# Patient Record
Sex: Male | Born: 1955 | Race: White | Hispanic: No | Smoking: Never smoker
Health system: Southern US, Community
[De-identification: ages and names within clinical notes are randomized; demographics above are authoritative.]

## PROBLEM LIST (undated history)

## (undated) DIAGNOSIS — K219 Gastro-esophageal reflux disease without esophagitis: Secondary | ICD-10-CM

## (undated) DIAGNOSIS — F32A Depression, unspecified: Secondary | ICD-10-CM

## (undated) DIAGNOSIS — F419 Anxiety disorder, unspecified: Secondary | ICD-10-CM

## (undated) DIAGNOSIS — I10 Essential (primary) hypertension: Secondary | ICD-10-CM

## (undated) HISTORY — PX: HERNIA REPAIR: SHX51

## (undated) HISTORY — PX: COLONOSCOPY: SHX174

---

## 2005-12-05 ENCOUNTER — Inpatient Hospital Stay (HOSPITAL_COMMUNITY): Admission: EM | Admit: 2005-12-05 | Discharge: 2005-12-06 | Payer: Self-pay | Admitting: Emergency Medicine

## 2005-12-05 ENCOUNTER — Ambulatory Visit: Payer: Self-pay | Admitting: Internal Medicine

## 2006-01-01 ENCOUNTER — Ambulatory Visit: Payer: Self-pay | Admitting: Internal Medicine

## 2006-01-01 ENCOUNTER — Ambulatory Visit (HOSPITAL_COMMUNITY): Admission: RE | Admit: 2006-01-01 | Discharge: 2006-01-01 | Payer: Self-pay | Admitting: Internal Medicine

## 2010-06-15 ENCOUNTER — Emergency Department (HOSPITAL_COMMUNITY): Payer: BC Managed Care – PPO

## 2010-06-15 ENCOUNTER — Encounter (HOSPITAL_COMMUNITY): Payer: Self-pay | Admitting: Radiology

## 2010-06-15 ENCOUNTER — Emergency Department (HOSPITAL_COMMUNITY)
Admission: EM | Admit: 2010-06-15 | Discharge: 2010-06-15 | Disposition: A | Discharge status: Home / Self Care | Payer: BC Managed Care – PPO | Attending: Emergency Medicine | Admitting: Emergency Medicine

## 2010-06-15 DIAGNOSIS — I10 Essential (primary) hypertension: Secondary | ICD-10-CM | POA: Insufficient documentation

## 2010-06-15 DIAGNOSIS — Y92009 Unspecified place in unspecified non-institutional (private) residence as the place of occurrence of the external cause: Secondary | ICD-10-CM | POA: Insufficient documentation

## 2010-06-15 DIAGNOSIS — Y998 Other external cause status: Secondary | ICD-10-CM | POA: Insufficient documentation

## 2010-06-15 DIAGNOSIS — Y93H3 Activity, building and construction: Secondary | ICD-10-CM | POA: Insufficient documentation

## 2010-06-15 DIAGNOSIS — S2220XA Unspecified fracture of sternum, initial encounter for closed fracture: Secondary | ICD-10-CM | POA: Insufficient documentation

## 2010-06-15 DIAGNOSIS — Z79899 Other long term (current) drug therapy: Secondary | ICD-10-CM | POA: Insufficient documentation

## 2010-06-15 DIAGNOSIS — W1789XA Other fall from one level to another, initial encounter: Secondary | ICD-10-CM | POA: Insufficient documentation

## 2010-06-15 HISTORY — DX: Essential (primary) hypertension: I10

## 2011-08-11 ENCOUNTER — Encounter (HOSPITAL_COMMUNITY): Payer: Self-pay | Admitting: *Deleted

## 2011-08-11 ENCOUNTER — Emergency Department (HOSPITAL_COMMUNITY)
Admission: EM | Admit: 2011-08-11 | Discharge: 2011-08-11 | Disposition: A | Discharge status: Home / Self Care | Payer: BC Managed Care – PPO | Attending: Emergency Medicine | Admitting: Emergency Medicine

## 2011-08-11 ENCOUNTER — Emergency Department (HOSPITAL_COMMUNITY): Payer: BC Managed Care – PPO

## 2011-08-11 DIAGNOSIS — L0390 Cellulitis, unspecified: Secondary | ICD-10-CM

## 2011-08-11 DIAGNOSIS — IMO0002 Reserved for concepts with insufficient information to code with codable children: Secondary | ICD-10-CM | POA: Insufficient documentation

## 2011-08-11 HISTORY — DX: Gastro-esophageal reflux disease without esophagitis: K21.9

## 2011-08-11 LAB — CBC
HCT: 43.6 % (ref 39.0–52.0)
Hemoglobin: 15.4 g/dL (ref 13.0–17.0)
MCH: 31.7 pg (ref 26.0–34.0)
MCHC: 35.3 g/dL (ref 30.0–36.0)
MCV: 89.7 fL (ref 78.0–100.0)
Platelets: 173 10*3/uL (ref 150–400)
RBC: 4.86 MIL/uL (ref 4.22–5.81)
RDW: 12.1 % (ref 11.5–15.5)
WBC: 16 10*3/uL — ABNORMAL HIGH (ref 4.0–10.5)

## 2011-08-11 LAB — BASIC METABOLIC PANEL
BUN: 9 mg/dL (ref 6–23)
CO2: 16 mEq/L — ABNORMAL LOW (ref 19–32)
Calcium: 9.5 mg/dL (ref 8.4–10.5)
Chloride: 99 mEq/L (ref 96–112)
Creatinine, Ser: 0.94 mg/dL (ref 0.50–1.35)
GFR calc Af Amer: >90 mL/min (ref >90)
GFR calc non Af Amer: >90 mL/min (ref >90)
Glucose, Bld: 185 mg/dL — ABNORMAL HIGH (ref 70–99)
Potassium: 3.8 mEq/L (ref 3.5–5.1)
Sodium: 132 mEq/L — ABNORMAL LOW (ref 135–145)

## 2011-08-11 LAB — DIFFERENTIAL
Basophils Absolute: 0.1 10*3/uL (ref 0.0–0.1)
Basophils Relative: 0 % (ref 0–1)
Eosinophils Absolute: 0.1 10*3/uL (ref 0.0–0.7)
Eosinophils Relative: 0 % (ref 0–5)
Lymphocytes Relative: 11 % — ABNORMAL LOW (ref 12–46)
Lymphs Abs: 1.8 10*3/uL (ref 0.7–4.0)
Monocytes Absolute: 2.2 10*3/uL — ABNORMAL HIGH (ref 0.1–1.0)
Monocytes Relative: 14 % — ABNORMAL HIGH (ref 3–12)
Neutro Abs: 11.9 10*3/uL — ABNORMAL HIGH (ref 1.7–7.7)
Neutrophils Relative %: 74 % (ref 43–77)

## 2011-08-11 LAB — C-REACTIVE PROTEIN: CRP: 12.05 mg/dL — ABNORMAL HIGH (ref <0.60)

## 2011-08-11 LAB — SEDIMENTATION RATE: Sed Rate: 20 mm/hr — ABNORMAL HIGH (ref 0–16)

## 2011-08-11 MED ORDER — VANCOMYCIN HCL IN DEXTROSE 1-5 GM/200ML-% IV SOLN
1000.0000 mg | Freq: Once | INTRAVENOUS | Status: AC
  Administered 2011-08-11: 1000 mg via INTRAVENOUS
  Filled 2011-08-11: qty 200

## 2011-08-11 MED ORDER — CEPHALEXIN 500 MG PO CAPS: 500.0000 mg | ORAL_CAPSULE | Freq: Four times a day (QID) | ORAL | Status: AC

## 2011-08-11 MED ORDER — SULFAMETHOXAZOLE-TRIMETHOPRIM 800-160 MG PO TABS: 1.0000 | ORAL_TABLET | Freq: Two times a day (BID) | ORAL | Status: AC

## 2011-08-11 MED ORDER — HYDROCODONE-ACETAMINOPHEN 5-325 MG PO TABS: 2.0000 | ORAL_TABLET | ORAL | Status: AC | PRN

## 2011-08-11 NOTE — Discharge Instructions (Signed)
Cellulitis Have your elbow rechecked in 48 hours. You can be checked in the ED or with your PCP. Return if the redness passes the marker line. Return to the ED if you develop new or worsening symptoms. Cellulitis is an infection of the skin and the tissue beneath it. The area is typically red and tender. It is caused by germs (bacteria) (usually staph or strep) that enter the body through cuts or sores. Cellulitis most commonly occurs in the arms or lower legs.  HOME CARE INSTRUCTIONS   If you are given a prescription for medications which kill germs (antibiotics), take as directed until finished.   If the infection is on the arm or leg, keep the limb elevated as able.   Use a warm cloth several times per day to relieve pain and encourage healing.   See your caregiver for recheck of the infected site as directed if problems arise.   Only take over-the-counter or prescription medicines for pain, discomfort, or fever as directed by your caregiver.  SEEK MEDICAL CARE IF:   The area of redness (inflammation) is spreading, there are red streaks coming from the infected site, or if a part of the infection begins to turn dark in color.   The joint or bone underneath the infected skin becomes painful after the skin has healed.   The infection returns in the same or another area after it seems to have gone away.   A boil or bump swells up. This may be an abscess.   New, unexplained problems such as pain or fever develop.  SEEK IMMEDIATE MEDICAL CARE IF:   You have a fever.   You or your child feels drowsy or lethargic.   There is vomiting, diarrhea, or lasting discomfort or feeling ill (malaise) with muscle aches and pains.  MAKE SURE YOU:   Understand these instructions.   Will watch your condition.   Will get help right away if you are not doing well or get worse.  Document Released: 11/14/2004 Document Revised: 01/24/2011 Document Reviewed: 09/23/2007 [facility] Patient Information  2012 [place], [place].

## 2011-08-11 NOTE — ED Notes (Signed)
Pt has large, swollen, red area to right elbow

## 2011-08-11 NOTE — ED Provider Notes (Signed)
History     CSN: [id number]  Arrival date & time 08/11/11  7829   First MD Initiated Contact with Patient 08/11/11 [phone]      Chief Complaint  Patient presents with  . Cellulitis    (Consider location/radiation/quality/duration/timing/severity/associated sxs/prior treatment) HPI Comments: Patient presents with pain, redness and swelling to right elbow for the past one day.  Thinks he may have been bitten by a spider but doesn't recall any cuts or trauma.  No fever or vomiting.  Some nausea this morning.  Able to flex and extend elbow normally. No weakness, numbness, tingling.  The history is provided by the patient.    Past Medical History  Diagnosis Date  . Hypertension   . Acid reflux     Past Surgical History  Procedure Date  . Hernia repair     History reviewed. No pertinent family history.  History  Substance Use Topics  . Smoking status: Not on file  . Smokeless tobacco: Not on file  . Alcohol Use: Yes      Review of Systems  Constitutional: Negative for fever and activity change.  HENT: Negative for congestion and rhinorrhea.   Eyes: Negative for visual disturbance.  Respiratory: Negative for cough, chest tightness and shortness of breath.   Cardiovascular: Negative for chest pain.  Gastrointestinal: Negative for nausea and abdominal pain.  Genitourinary: Negative for dysuria and hematuria.  Musculoskeletal: Positive for joint swelling.  Skin: Positive for rash.  Neurological: Negative for dizziness, weakness and headaches.    Allergies  Review of patient's allergies indicates no known allergies.  Home Medications   Current Outpatient Rx  Name Route Sig Dispense Refill  . PANTOPRAZOLE SODIUM 40 MG PO TBEC Oral Take 40 mg by mouth daily.    Marland Kitchen UNKNOWN TO PATIENT        BP 145/89  Pulse 96  Temp 97.9 F (36.6 C)  Resp 20  Ht 5\' 10"  (1.778 m)  Wt 210 lb (95.255 kg)  BMI 30.13 kg/m2  SpO2 100%  Physical Exam  Constitutional: He is oriented  to person, place, and time. He appears well-developed and well-nourished. No distress.  HENT:  Head: Normocephalic and atraumatic.  Mouth/Throat: Oropharynx is clear and moist. No oropharyngeal exudate.  Eyes: Conjunctivae are normal. Pupils are equal, round, and reactive to light.  Neck: Normal range of motion. Neck supple.  Cardiovascular: Normal rate, regular rhythm and normal heart sounds.   No murmur heard. Pulmonary/Chest: Effort normal and breath sounds normal. No respiratory distress.  Abdominal: Soft. There is no tenderness. There is no rebound and no guarding.  Musculoskeletal: Normal range of motion. He exhibits edema and tenderness.       6 x 12 cm area of erythema, edema, warmth, involving R elbow.  No fluctuance. FROM of elbow joint.  +2 radial pulse.  Neurological: He is alert and oriented to person, place, and time. No cranial nerve deficit.  Skin: Skin is warm.    ED Course  Procedures (including critical care time)  Labs Reviewed  CBC - Abnormal; Notable for the following:    WBC 16.0 (*)     All other components within normal limits  DIFFERENTIAL - Abnormal; Notable for the following:    Neutro Abs 11.9 (*)     Lymphocytes Relative 11 (*)     Monocytes Relative 14 (*)     Monocytes Absolute 2.2 (*)     All other components within normal limits  BASIC METABOLIC PANEL  SEDIMENTATION RATE  C-REACTIVE PROTEIN   Dg Elbow Complete Right  08/11/2011  *RADIOLOGY REPORT*  Clinical Data: Right elbow swelling and erythema.  RIGHT ELBOW - COMPLETE 3+ VIEW  Comparison: None.  Findings: There is no evidence of fracture or dislocation.  The visualized joint spaces are preserved.  No significant joint effusion is identified.  The soft tissues are unremarkable in appearance.  IMPRESSION: No evidence of fracture or dislocation.  Original Report Authenticated By: Tonia Ghent, M.D.     1. Cellulitis       MDM  Cellulitis overlying elbow joint.  No fever.    Able to flex  and extend elbow joint normally.  Low suspicion for septic joint.  Arthrocentesis contraindicated in presence of cellulitis anyway.  Area of cellulitis outlined. Xray, vancomycin, recheck in 48 hours.        [name], MD 08/11/11 [phone]

## 2012-06-03 ENCOUNTER — Encounter (HOSPITAL_COMMUNITY): Payer: Self-pay

## 2012-06-03 ENCOUNTER — Other Ambulatory Visit (HOSPITAL_COMMUNITY): Payer: Self-pay | Admitting: Orthopaedic Surgery

## 2012-06-03 ENCOUNTER — Emergency Department (HOSPITAL_COMMUNITY): Payer: BC Managed Care – PPO

## 2012-06-03 ENCOUNTER — Emergency Department (HOSPITAL_COMMUNITY)
Admission: EM | Admit: 2012-06-03 | Discharge: 2012-06-03 | Disposition: A | Discharge status: Home / Self Care | Payer: BC Managed Care – PPO | Attending: Emergency Medicine | Admitting: Emergency Medicine

## 2012-06-03 DIAGNOSIS — K219 Gastro-esophageal reflux disease without esophagitis: Secondary | ICD-10-CM | POA: Insufficient documentation

## 2012-06-03 DIAGNOSIS — I10 Essential (primary) hypertension: Secondary | ICD-10-CM | POA: Insufficient documentation

## 2012-06-03 DIAGNOSIS — M25562 Pain in left knee: Secondary | ICD-10-CM

## 2012-06-03 DIAGNOSIS — X500XXA Overexertion from strenuous movement or load, initial encounter: Secondary | ICD-10-CM | POA: Insufficient documentation

## 2012-06-03 DIAGNOSIS — Z79899 Other long term (current) drug therapy: Secondary | ICD-10-CM | POA: Insufficient documentation

## 2012-06-03 DIAGNOSIS — Y9301 Activity, walking, marching and hiking: Secondary | ICD-10-CM | POA: Insufficient documentation

## 2012-06-03 DIAGNOSIS — Y929 Unspecified place or not applicable: Secondary | ICD-10-CM | POA: Insufficient documentation

## 2012-06-03 DIAGNOSIS — S99929A Unspecified injury of unspecified foot, initial encounter: Secondary | ICD-10-CM | POA: Insufficient documentation

## 2012-06-03 DIAGNOSIS — S8990XA Unspecified injury of unspecified lower leg, initial encounter: Secondary | ICD-10-CM | POA: Insufficient documentation

## 2012-06-03 DIAGNOSIS — S8992XA Unspecified injury of left lower leg, initial encounter: Secondary | ICD-10-CM

## 2012-06-03 MED ORDER — IBUPROFEN 800 MG PO TABS
800.0000 mg | ORAL_TABLET | Freq: Once | ORAL | Status: AC
  Administered 2012-06-03: 800 mg via ORAL
  Filled 2012-06-03: qty 1

## 2012-06-03 MED ORDER — HYDROCODONE-ACETAMINOPHEN 5-325 MG PO TABS: 2.0000 | ORAL_TABLET | ORAL | Status: DC | PRN

## 2012-06-03 MED ORDER — IBUPROFEN 800 MG PO TABS: 800.0000 mg | ORAL_TABLET | Freq: Three times a day (TID) | ORAL | Status: DC

## 2012-06-03 NOTE — ED Notes (Signed)
Pt reports twisted left knee last week cutting fire wood.

## 2012-06-03 NOTE — ED Provider Notes (Signed)
History    This chart was scribed for [name], MD by Quintella Reichert, ED scribe.  This patient was seen in room APA09/APA09 and the patient's care was started at 8:34 AM  . CSN: [id number]  Arrival date & time 06/03/12  7829    Chief Complaint  Patient presents with  . Knee Pain     The history is provided by the patient. No language interpreter was used.     HPI Comments: Marco Stevenson is a 57 y.o. male who presents to the Emergency Department complaining of moderate, sudden-onset left knee pain that began one week ago when the patient twisted his knee.  Pt states that the pain is exacerbated by bearing weight on that leg, although he is able to stand and walk.  He states he did not fall, and denies prior knee pain.  Pt denies weakness or numbness of lower extremities, back pain, fever, sore throat, cough, SOB, nausea, emesis, or any other associated symptoms.  He has not sought prior medical treatment for current symptom, and has taken ibuprofen at home.  Pt has medical h/o HTN and GERD.  PCP is Dr. Sudie Bailey.   Past Medical History  Diagnosis Date  . Hypertension   . Acid reflux     Past Surgical History  Procedure Laterality Date  . Hernia repair      No family history on file.  History  Substance Use Topics  . Smoking status: Not on file  . Smokeless tobacco: Not on file  . Alcohol Use: Yes     Comment: occ      Review of Systems A complete 10 system review of systems was obtained and all systems are negative except as noted in the HPI and PMH.    Allergies  Review of patient's allergies indicates no known allergies.  Home Medications   Current Outpatient Rx  Name  Route  Sig  Dispense  Refill  . ibuprofen (ADVIL,MOTRIN) 200 MG tablet   Oral   Take 400 mg by mouth every 4 (four) hours as needed for pain.         Marland Kitchen losartan (COZAAR) 50 MG tablet   Oral   Take 50 mg by mouth daily.         . pantoprazole (PROTONIX) 40 MG tablet  Oral   Take 40 mg by mouth daily.         Marland Kitchen HYDROcodone-acetaminophen (NORCO/VICODIN) 5-325 MG per tablet   Oral   Take 2 tablets by mouth every 4 (four) hours as needed for pain.   10 tablet   0   . ibuprofen (ADVIL,MOTRIN) 800 MG tablet   Oral   Take 1 tablet (800 mg total) by mouth 3 (three) times daily.   21 tablet   0     BP 137/91  Pulse 71  Temp(Src) 98 F (36.7 C) (Oral)  Resp 18  Ht 5\' 8"  (1.727 m)  Wt 210 lb (95.255 kg)  BMI 31.94 kg/m2  SpO2 98%  Physical Exam  Nursing note and vitals reviewed. Constitutional: He is oriented to person, place, and time. He appears well-developed and well-nourished. No distress.  HENT:  Head: Normocephalic and atraumatic.  Eyes: Conjunctivae and EOM are normal. Pupils are equal, round, and reactive to light.  Neck: Normal range of motion. Neck supple.  Cardiovascular: Normal rate, regular rhythm and intact distal pulses.   No murmur heard. Pulmonary/Chest: Effort normal and breath sounds normal. No respiratory distress.  Abdominal: Soft.  There is no tenderness.  Musculoskeletal: Normal range of motion. He exhibits tenderness.  Left knee tenderness over medial joint line.   No effusion.   Flexion and extension intact.   No ligament laxity.   Anterior and posterior Drawer test negative.   Intact distal pulses.  Neurological: He is alert and oriented to person, place, and time.  Skin: Skin is warm and dry.  Psychiatric: He has a normal mood and affect. His behavior is normal.    ED Course  Procedures (including critical care time)  DIAGNOSTIC STUDIES: Oxygen Saturation is 98% on room air, normal by my interpretation.    COORDINATION OF CARE: 9:19 AM-Discussed treatment plan which includes x-ray and pain medication with pt at bedside and pt agreed to plan.     Labs Reviewed - No data to display Dg Knee Complete 4 Views Left  06/03/2012  *RADIOLOGY REPORT*  Clinical Data: Twisted.  Medial pain.  LEFT KNEE -  COMPLETE 4+ VIEW  Comparison: None.  Findings: There is a knee joint effusion. There is medial soft tissue swelling.  No evidence of fracture, dislocation, degenerative change or other focal finding.  IMPRESSION: Joint effusion.  Medial soft tissue swelling.  No bony finding.   Original Report Authenticated By: Paulina Fusi, M.D.      1. Knee injury, left, initial encounter       MDM  One week of L knee pain after twisting it while walking. Denies fall. No weakness, numbness, tingling.   Flexion, extension intact. No appreciable ligament laxity.  Xray negative for fracture. Knee immobilizer, RICE, f/u ortho.   I personally performed the services described in this documentation, which was scribed in my presence. The recorded information has been reviewed and is accurate.      [name], MD 06/03/12 [phone]

## 2012-06-04 ENCOUNTER — Encounter (HOSPITAL_COMMUNITY): Payer: Self-pay

## 2012-06-04 ENCOUNTER — Ambulatory Visit (HOSPITAL_COMMUNITY)
Admission: RE | Admit: 2012-06-04 | Discharge: 2012-06-04 | Disposition: A | Discharge status: Home / Self Care | Payer: BC Managed Care – PPO | Source: Ambulatory Visit | Attending: Orthopaedic Surgery | Admitting: Orthopaedic Surgery

## 2012-06-04 DIAGNOSIS — M25562 Pain in left knee: Secondary | ICD-10-CM

## 2012-06-04 DIAGNOSIS — M25569 Pain in unspecified knee: Secondary | ICD-10-CM | POA: Insufficient documentation

## 2012-06-04 DIAGNOSIS — M7120 Synovial cyst of popliteal space [Baker], unspecified knee: Secondary | ICD-10-CM | POA: Insufficient documentation

## 2012-06-04 DIAGNOSIS — S7290XA Unspecified fracture of unspecified femur, initial encounter for closed fracture: Secondary | ICD-10-CM | POA: Insufficient documentation

## 2012-06-08 ENCOUNTER — Other Ambulatory Visit (HOSPITAL_COMMUNITY): Payer: Self-pay | Admitting: Orthopaedic Surgery

## 2012-06-08 DIAGNOSIS — M810 Age-related osteoporosis without current pathological fracture: Secondary | ICD-10-CM

## 2012-06-12 ENCOUNTER — Ambulatory Visit (HOSPITAL_COMMUNITY)
Admission: RE | Admit: 2012-06-12 | Discharge: 2012-06-12 | Disposition: A | Discharge status: Home / Self Care | Payer: BC Managed Care – PPO | Source: Ambulatory Visit | Attending: Orthopaedic Surgery | Admitting: Orthopaedic Surgery

## 2012-06-12 DIAGNOSIS — M810 Age-related osteoporosis without current pathological fracture: Secondary | ICD-10-CM

## 2012-06-12 DIAGNOSIS — Z8781 Personal history of (healed) traumatic fracture: Secondary | ICD-10-CM | POA: Insufficient documentation

## 2012-06-12 DIAGNOSIS — Z13820 Encounter for screening for osteoporosis: Secondary | ICD-10-CM | POA: Insufficient documentation

## 2012-06-12 DIAGNOSIS — Z8262 Family history of osteoporosis: Secondary | ICD-10-CM | POA: Insufficient documentation

## 2014-06-13 IMAGING — CR DG KNEE COMPLETE 4+V*L*
4 series · 4 of 4 positions shown · non-contrast
Comparison: None.

CLINICAL DATA: Twisted.  Medial pain.

LEFT KNEE - COMPLETE 4+ VIEW

[view not recorded (1 of 4)]
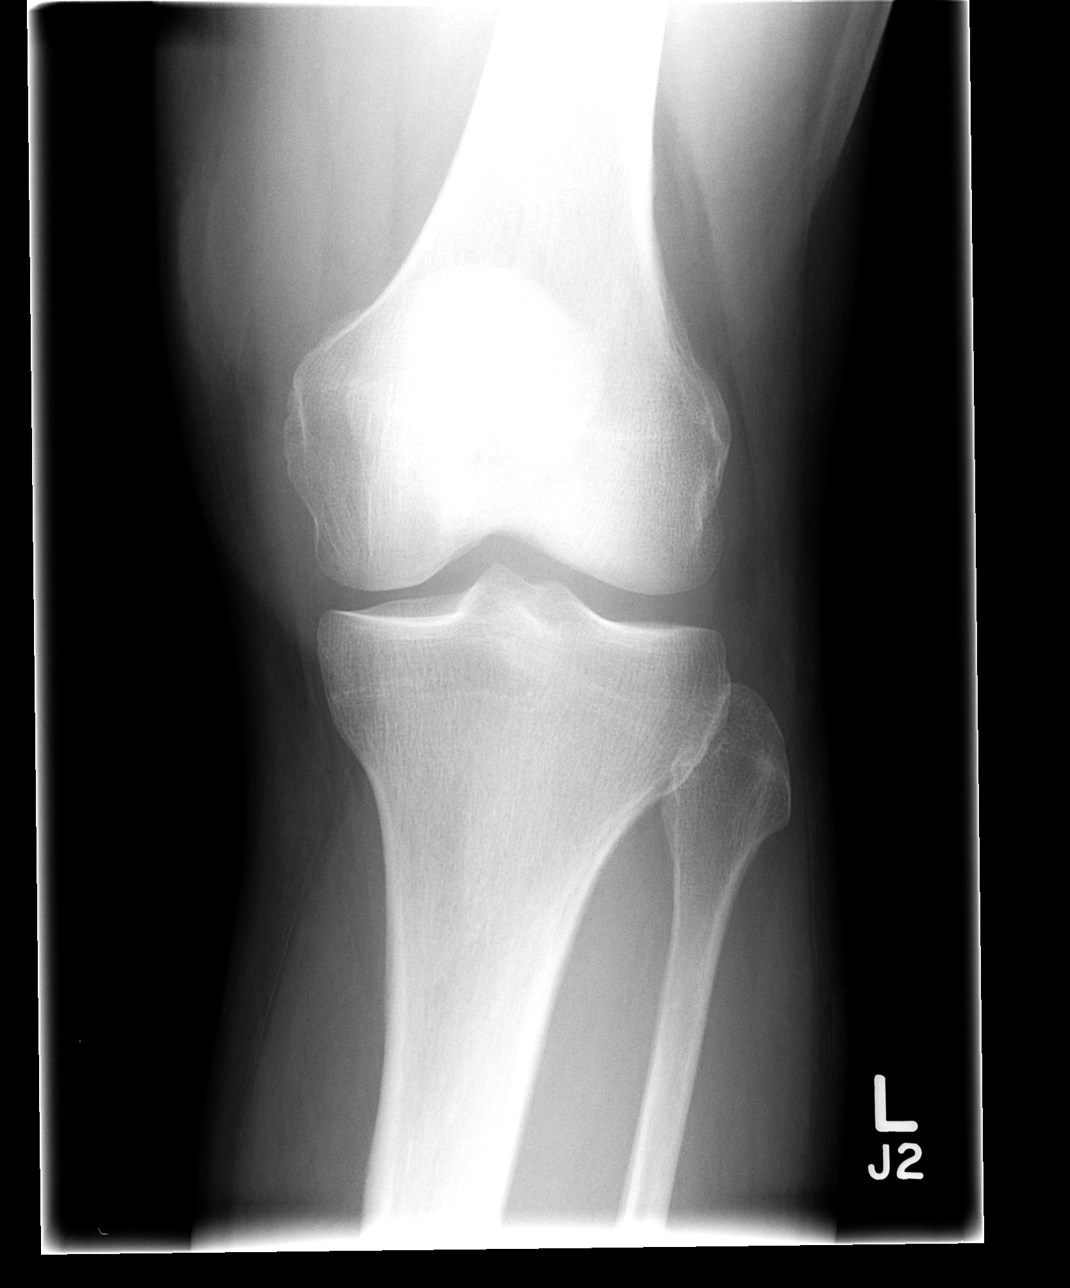

[view not recorded (2 of 4)]
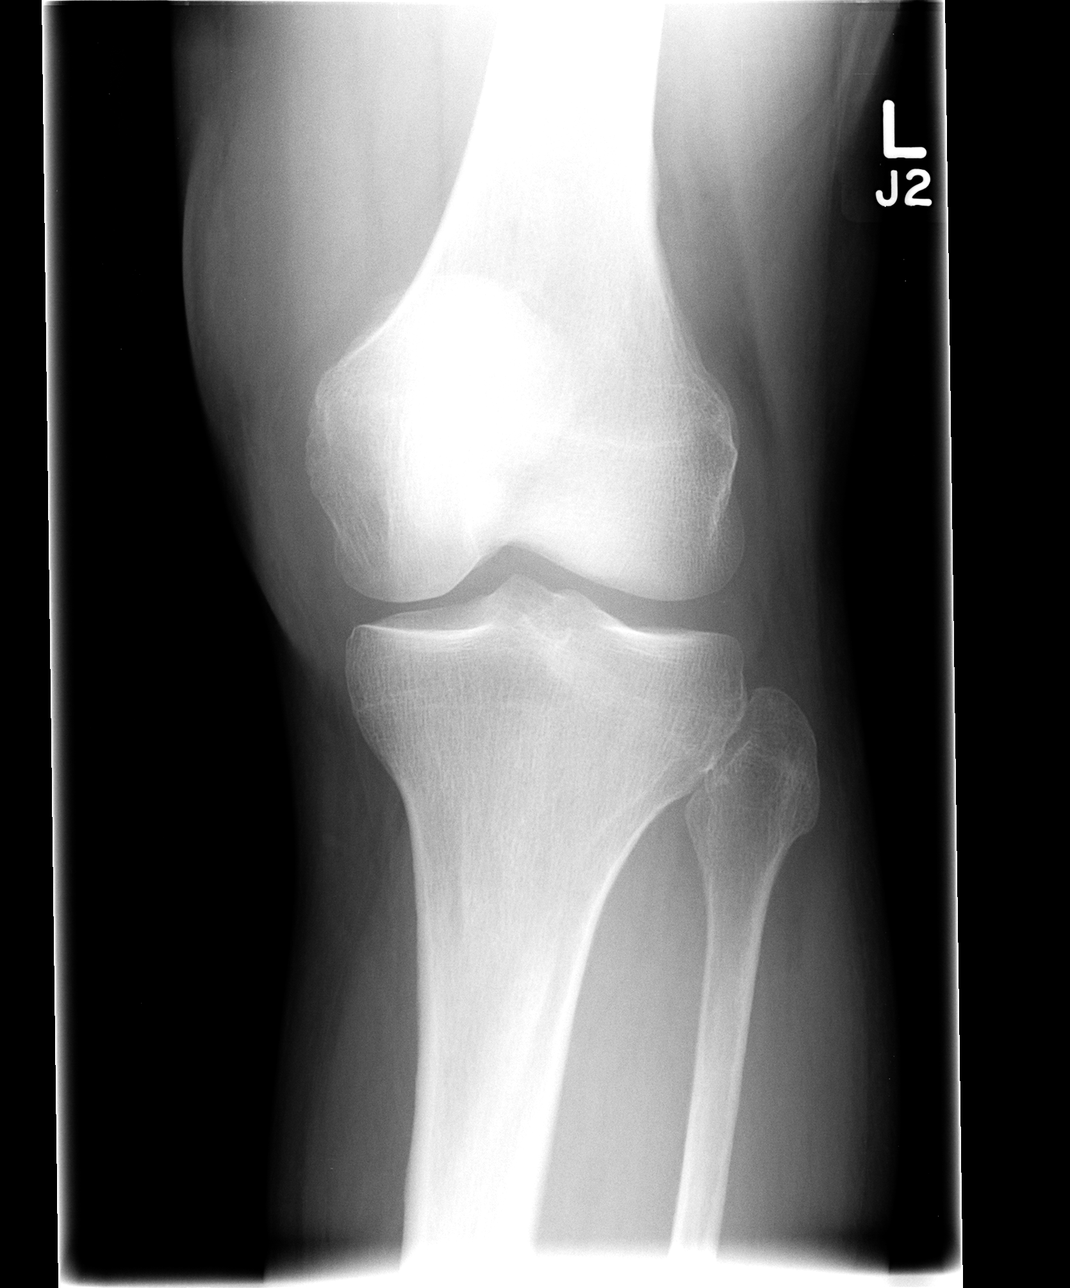

[view not recorded (3 of 4)]
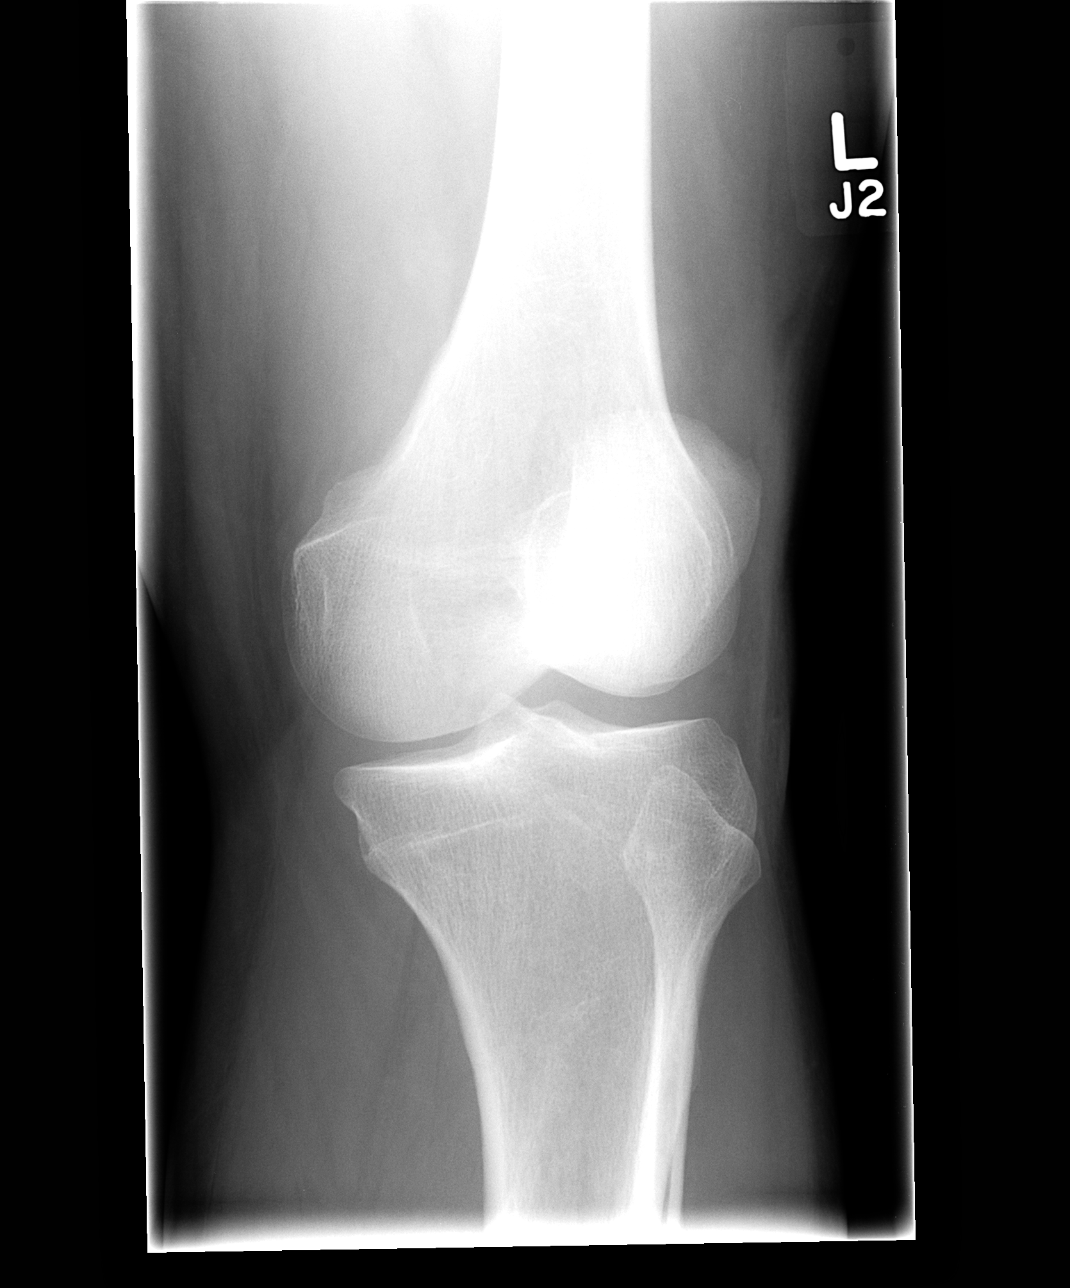

[view not recorded (4 of 4)]
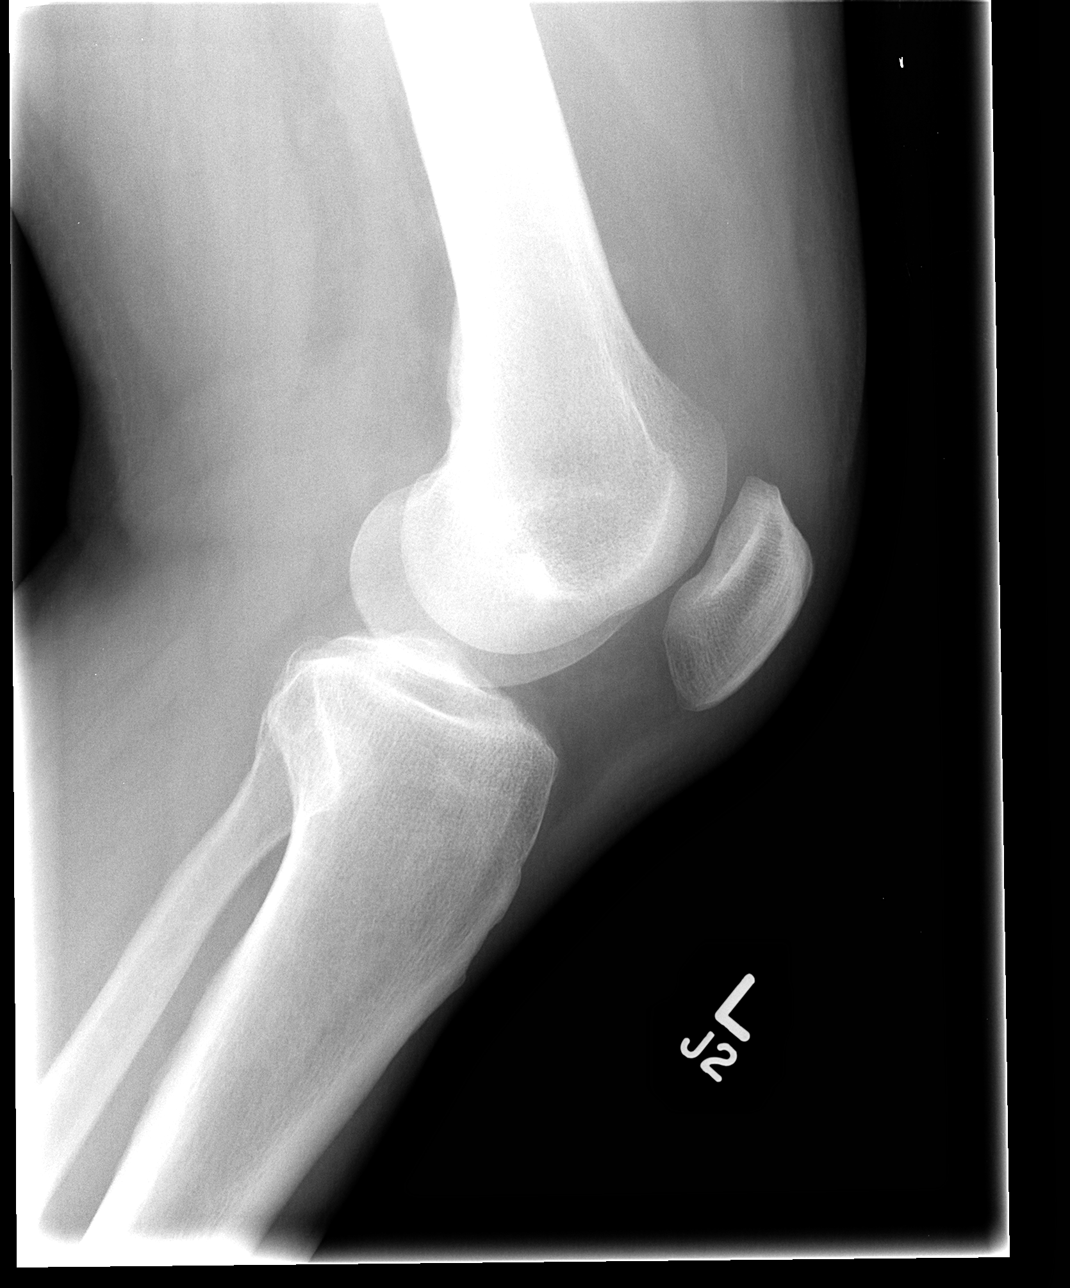

[4 of 4 positions shown; findings below may reference images not displayed]

FINDINGS: There is a knee joint effusion. There is medial soft
tissue swelling.  No evidence of fracture, dislocation,
degenerative change or other focal finding.
IMPRESSION: Joint effusion.  Medial soft tissue swelling.  No bony finding.

## 2015-04-10 ENCOUNTER — Emergency Department (HOSPITAL_COMMUNITY): Payer: BLUE CROSS/BLUE SHIELD

## 2015-04-10 ENCOUNTER — Encounter (HOSPITAL_COMMUNITY): Payer: Self-pay | Admitting: *Deleted

## 2015-04-10 ENCOUNTER — Emergency Department (HOSPITAL_COMMUNITY)
Admission: EM | Admit: 2015-04-10 | Discharge: 2015-04-10 | Disposition: A | Discharge status: Home / Self Care | Payer: BLUE CROSS/BLUE SHIELD | Attending: Emergency Medicine | Admitting: Emergency Medicine

## 2015-04-10 DIAGNOSIS — M546 Pain in thoracic spine: Secondary | ICD-10-CM | POA: Diagnosis not present

## 2015-04-10 DIAGNOSIS — R5383 Other fatigue: Secondary | ICD-10-CM | POA: Insufficient documentation

## 2015-04-10 DIAGNOSIS — Z79899 Other long term (current) drug therapy: Secondary | ICD-10-CM | POA: Insufficient documentation

## 2015-04-10 DIAGNOSIS — R079 Chest pain, unspecified: Secondary | ICD-10-CM | POA: Insufficient documentation

## 2015-04-10 DIAGNOSIS — I10 Essential (primary) hypertension: Secondary | ICD-10-CM | POA: Insufficient documentation

## 2015-04-10 DIAGNOSIS — K219 Gastro-esophageal reflux disease without esophagitis: Secondary | ICD-10-CM | POA: Diagnosis not present

## 2015-04-10 LAB — CBC
HCT: 47.2 % (ref 39.0–52.0)
Hemoglobin: 16.6 g/dL (ref 13.0–17.0)
MCH: 32.9 pg (ref 26.0–34.0)
MCHC: 35.2 g/dL (ref 30.0–36.0)
MCV: 93.5 fL (ref 78.0–100.0)
Platelets: 235 10*3/uL (ref 150–400)
RBC: 5.05 MIL/uL (ref 4.22–5.81)
RDW: 12.1 % (ref 11.5–15.5)
WBC: 7.3 10*3/uL (ref 4.0–10.5)

## 2015-04-10 LAB — BASIC METABOLIC PANEL
Anion gap: 7 (ref 5–15)
BUN: 14 mg/dL (ref 6–20)
CO2: 27 mmol/L (ref 22–32)
Calcium: 8.8 mg/dL — ABNORMAL LOW (ref 8.9–10.3)
Chloride: 106 mmol/L (ref 101–111)
Creatinine, Ser: 0.91 mg/dL (ref 0.61–1.24)
GFR calc Af Amer: >60 mL/min (ref >60)
GFR calc non Af Amer: >60 mL/min (ref >60)
Glucose, Bld: 97 mg/dL (ref 65–99)
Potassium: 4.2 mmol/L (ref 3.5–5.1)
Sodium: 140 mmol/L (ref 135–145)

## 2015-04-10 LAB — TROPONIN I
Troponin I: <0.03 ng/mL (ref <0.031)
Troponin I: <0.03 ng/mL (ref <0.031)

## 2015-04-10 NOTE — Discharge Instructions (Signed)

## 2015-04-10 NOTE — ED Notes (Signed)
Pt states center chest pain yesterday, radiating to the back, yesterday and lasted for a couple of hours and eased up, per Pt . Pt states slight discomfort to center chest today, stating that it feels like heart burn and worsening pain when taking a deep breath.

## 2015-04-10 NOTE — ED Provider Notes (Signed)
CSN: [id number]     Arrival date & time 04/10/15  1122 History  By signing my name below, I, Marco Stevenson, attest that this documentation has been prepared under the direction and in the presence of [name], MD. Electronically Signed: Marica Stevenson, ED Scribe. 04/10/2015. 1:12 PM.  Chief Complaint  Patient presents with  . Chest Pain   The history is provided by the patient. No language interpreter was used.   PCP: Milana Obey, MD HPI Comments: Marco Stevenson is a 60 y.o. male, with PMHx noted below including acid reflux and HTN, who presents to the Emergency Department complaining of intermittent, improving, atraumatic, radiating, aching, centered chest pain radiating to the back onset yesterday. Pt reports the episode of pain lasted approximately one hour. Pt reports he was at rest when the Sx began. Pt denies any associated nausea, vomiting, SOB, diaphoresis, LOC, weakness with onset of Sx. Pt reports that while his chest pain has improved, he continues to feel very mild, intermittent burning sensation in his chest. Pt also reports that he felt very fatigued while at work today. Pt denies abd pain, cough, fever, swelling of BLE, haematemesis, melena, or any other Sx at this time. Pt denies Hx of DM or HLD. Pt further denies Hx of tobacco use. Pt denies any Hx of stress test.   No immediate family h/o CAD   Past Medical History  Diagnosis Date  . Hypertension   . Acid reflux    Past Surgical History  Procedure Laterality Date  . Hernia repair     No family history on file. Social History  Substance Use Topics  . Smoking status: Never Smoker   . Smokeless tobacco: None  . Alcohol Use: Yes     Comment: occ    Review of Systems  Constitutional: Positive for fatigue. Negative for fever and diaphoresis.  Respiratory: Negative for cough and shortness of breath.   Cardiovascular: Positive for chest pain. Negative for leg swelling.  Gastrointestinal: Negative for  nausea, vomiting, abdominal pain and blood in stool.  Musculoskeletal: Positive for back pain.  Neurological: Negative for weakness.  All other systems reviewed and are negative.  Allergies  Review of patient's allergies indicates no known allergies.  Home Medications   Prior to Admission medications   Medication Sig Start Date End Date Taking? Authorizing Provider  ibuprofen (ADVIL,MOTRIN) 200 MG tablet Take 400 mg by mouth every 4 (four) hours as needed for pain.   Yes Historical Provider, MD  losartan (COZAAR) 50 MG tablet Take 50 mg by mouth daily.   Yes Historical Provider, MD  Multiple Vitamins-Minerals (MULTIVITAMIN WITH MINERALS) tablet Take 1 tablet by mouth daily.   Yes Historical Provider, MD  pantoprazole (PROTONIX) 40 MG tablet Take 40 mg by mouth daily.   Yes Historical Provider, MD  HYDROcodone-acetaminophen (NORCO/VICODIN) 5-325 MG per tablet Take 2 tablets by mouth every 4 (four) hours as needed for pain. Patient not taking: Reported on 04/10/2015 06/03/12   [name], MD   Triage Vitals: BP 142/94 mmHg  Pulse 78  Temp(Src) 98.6 F (37 C) (Oral)  Resp 20  Ht  (1.753 m)  Wt 210 lb (95.255 kg)  BMI 31.00 kg/m2  SpO2 97% Physical Exam CONSTITUTIONAL: Well developed/well nourished HEAD: Normocephalic/atraumatic EYES: EOMI/PERRL ENMT: Mucous membranes moist NECK: supple no meningeal signs SPINE/BACK:entire spine nontender, mild tenderness to right parathoracic region CV: S1/S2 noted, no murmurs/rubs/gallops noted LUNGS: Lungs are clear to auscultation bilaterally, no apparent distress ABDOMEN: soft, nontender,  no rebound or guarding, bowel sounds noted throughout abdomen GU:no cva tenderness NEURO: Pt is awake/alert/appropriate, moves all extremitiesx4.  No facial droop.   EXTREMITIES: pulses normal/equal, full ROM, no edema, no calf tenderness SKIN: warm, color normal PSYCH: no abnormalities of mood noted, alert and oriented to situation  ED Course   Procedures DIAGNOSTIC STUDIES: Oxygen Saturation is 97% on ra, nl by my interpretation.    COORDINATION OF CARE: 12:55 PM: Discussed treatment plan which includes imaging, labs, EKG with pt at bedside; patient verbalizes understanding and agrees with treatment plan.  HEART score = 3 Will get 3 hour troponin I doubt PE/Dissection at this time Pt agreeable with plan 3:45 PM Pt pain free No other acute symptoms Repeat troponin negative Will d/c home  Labs Review Labs Reviewed  BASIC METABOLIC PANEL - Abnormal; Notable for the following:    Calcium 8.8 (*)    All other components within normal limits  CBC  TROPONIN I  TROPONIN I    Imaging Review Dg Chest 2 View  04/10/2015  CLINICAL DATA:  60 year old male with chest pain EXAM: CHEST  2 VIEW COMPARISON:  Radiograph dated 06/15/2010 FINDINGS: Two views of the chest demonstrate increased linear interstitial densities in the lingula, likely atelectatic changes. Developing pneumonia is less likely but not excluded. Clinical correlation is recommended. There is no focal consolidation, pleural effusion, or pneumothorax. Stable cardiac silhouette. No acute osseous pathology. IMPRESSION: Linear densities within the lingula, likely atelectatic changes. Developing pneumonia is less likely. Clinical correlation is recommended. No focal consolidation. Electronically Signed   By: Elgie Collard M.D.   On: 04/10/2015 12:28   I have personally reviewed and evaluated these images and lab results as part of my medical decision-making.   EKG Interpretation   Date/Time:  Monday April 10 2015 12:58:12 EST Ventricular Rate:  69 PR Interval:  158 QRS Duration: 87 QT Interval:  388 QTC Calculation: 416 R Axis:   33 Text Interpretation:  Sinus rhythm Baseline wander in lead(s) V2 V3 V4 V5  V6 No significant change since last tracing Confirmed by Bebe Shaggy  MD,  Dorinda Hill ([id number]) on 04/10/2015 1:17:49 PM      MDM   Final diagnoses:  Chest  pain, unspecified chest pain type    Nursing notes including past medical history and social history reviewed and considered in documentation xrays/imaging reviewed by myself and considered during evaluation Labs/vital reviewed myself and considered during evaluation   I personally performed the services described in this documentation, which was scribed in my presence. The recorded information has been reviewed and is accurate.       [name], MD 04/10/15 [phone]

## 2015-12-04 ENCOUNTER — Telehealth: Payer: Self-pay | Admitting: Internal Medicine

## 2015-12-04 NOTE — Telephone Encounter (Signed)
Recall for tcs °

## 2015-12-04 NOTE — Telephone Encounter (Signed)
Letter mailed

## 2018-05-11 ENCOUNTER — Encounter (HOSPITAL_COMMUNITY): Payer: Self-pay | Admitting: Emergency Medicine

## 2018-05-11 ENCOUNTER — Other Ambulatory Visit: Payer: Self-pay

## 2018-05-11 ENCOUNTER — Emergency Department (HOSPITAL_COMMUNITY): Payer: Commercial Managed Care - PPO

## 2018-05-11 ENCOUNTER — Inpatient Hospital Stay (HOSPITAL_COMMUNITY)
Admission: EM | Admit: 2018-05-11 | Discharge: 2018-05-15 | DRG: 378 | Disposition: A | Discharge status: Home / Self Care | Payer: Commercial Managed Care - PPO | Attending: Internal Medicine | Admitting: Internal Medicine

## 2018-05-11 DIAGNOSIS — K921 Melena: Secondary | ICD-10-CM | POA: Diagnosis present

## 2018-05-11 DIAGNOSIS — E860 Dehydration: Secondary | ICD-10-CM | POA: Diagnosis present

## 2018-05-11 DIAGNOSIS — K259 Gastric ulcer, unspecified as acute or chronic, without hemorrhage or perforation: Secondary | ICD-10-CM | POA: Diagnosis not present

## 2018-05-11 DIAGNOSIS — S0990XA Unspecified injury of head, initial encounter: Secondary | ICD-10-CM | POA: Diagnosis present

## 2018-05-11 DIAGNOSIS — X58XXXA Exposure to other specified factors, initial encounter: Secondary | ICD-10-CM | POA: Diagnosis present

## 2018-05-11 DIAGNOSIS — F1020 Alcohol dependence, uncomplicated: Secondary | ICD-10-CM | POA: Diagnosis present

## 2018-05-11 DIAGNOSIS — Z8711 Personal history of peptic ulcer disease: Secondary | ICD-10-CM | POA: Diagnosis not present

## 2018-05-11 DIAGNOSIS — Z79899 Other long term (current) drug therapy: Secondary | ICD-10-CM

## 2018-05-11 DIAGNOSIS — K219 Gastro-esophageal reflux disease without esophagitis: Secondary | ICD-10-CM | POA: Diagnosis present

## 2018-05-11 DIAGNOSIS — R55 Syncope and collapse: Secondary | ICD-10-CM | POA: Diagnosis present

## 2018-05-11 DIAGNOSIS — Z8249 Family history of ischemic heart disease and other diseases of the circulatory system: Secondary | ICD-10-CM | POA: Diagnosis not present

## 2018-05-11 DIAGNOSIS — F1010 Alcohol abuse, uncomplicated: Secondary | ICD-10-CM | POA: Diagnosis present

## 2018-05-11 DIAGNOSIS — K922 Gastrointestinal hemorrhage, unspecified: Principal | ICD-10-CM | POA: Diagnosis present

## 2018-05-11 DIAGNOSIS — K317 Polyp of stomach and duodenum: Secondary | ICD-10-CM | POA: Diagnosis present

## 2018-05-11 DIAGNOSIS — I10 Essential (primary) hypertension: Secondary | ICD-10-CM | POA: Diagnosis present

## 2018-05-11 DIAGNOSIS — D62 Acute posthemorrhagic anemia: Secondary | ICD-10-CM | POA: Diagnosis present

## 2018-05-11 LAB — COMPREHENSIVE METABOLIC PANEL
ALT: 28 U/L (ref 0–44)
AST: 22 U/L (ref 15–41)
Albumin: 3.3 g/dL — ABNORMAL LOW (ref 3.5–5.0)
Alkaline Phosphatase: 50 U/L (ref 38–126)
Anion gap: 9 (ref 5–15)
BUN: 27 mg/dL — ABNORMAL HIGH (ref 6–20)
CO2: 20 mmol/L — ABNORMAL LOW (ref 22–32)
Calcium: 8.2 mg/dL — ABNORMAL LOW (ref 8.9–10.3)
Chloride: 108 mmol/L (ref 98–111)
Creatinine, Ser: 0.86 mg/dL (ref 0.61–1.24)
GFR calc Af Amer: >60 mL/min (ref >60)
GFR calc non Af Amer: >60 mL/min (ref >60)
Glucose, Bld: 179 mg/dL — ABNORMAL HIGH (ref 70–99)
Potassium: 3.9 mmol/L (ref 3.5–5.1)
Sodium: 137 mmol/L (ref 135–145)
Total Bilirubin: 0.7 mg/dL (ref 0.3–1.2)
Total Protein: 5.9 g/dL — ABNORMAL LOW (ref 6.5–8.1)

## 2018-05-11 LAB — PROTIME-INR
INR: 1.1 (ref 0.8–1.2)
Prothrombin Time: 14 seconds (ref 11.4–15.2)

## 2018-05-11 LAB — CBC WITH DIFFERENTIAL/PLATELET
Abs Immature Granulocytes: 0.06 10*3/uL (ref 0.00–0.07)
Basophils Absolute: 0.1 10*3/uL (ref 0.0–0.1)
Basophils Relative: 1 %
Eosinophils Absolute: 0.1 10*3/uL (ref 0.0–0.5)
Eosinophils Relative: 1 %
HCT: 35.7 % — ABNORMAL LOW (ref 39.0–52.0)
Hemoglobin: 12.2 g/dL — ABNORMAL LOW (ref 13.0–17.0)
Immature Granulocytes: 0 %
Lymphocytes Relative: 15 %
Lymphs Abs: 2.2 10*3/uL (ref 0.7–4.0)
MCH: 31.4 pg (ref 26.0–34.0)
MCHC: 34.2 g/dL (ref 30.0–36.0)
MCV: 91.8 fL (ref 80.0–100.0)
Monocytes Absolute: 1.5 10*3/uL — ABNORMAL HIGH (ref 0.1–1.0)
Monocytes Relative: 10 %
Neutro Abs: 10.5 10*3/uL — ABNORMAL HIGH (ref 1.7–7.7)
Neutrophils Relative %: 73 %
Platelets: 258 10*3/uL (ref 150–400)
RBC: 3.89 MIL/uL — ABNORMAL LOW (ref 4.22–5.81)
RDW: 12.5 % (ref 11.5–15.5)
WBC: 14.4 10*3/uL — ABNORMAL HIGH (ref 4.0–10.5)
nRBC: 0 % (ref 0.0–0.2)

## 2018-05-11 LAB — ETHANOL: Alcohol, Ethyl (B): <10 mg/dL (ref <10)

## 2018-05-11 LAB — CBG MONITORING, ED: Glucose-Capillary: 166 mg/dL — ABNORMAL HIGH (ref 70–99)

## 2018-05-11 LAB — TYPE AND SCREEN
ABO/RH(D): B POS
Antibody Screen: NEGATIVE

## 2018-05-11 LAB — LIPASE, BLOOD: Lipase: 33 U/L (ref 11–51)

## 2018-05-11 LAB — MAGNESIUM: Magnesium: 1.6 mg/dL — ABNORMAL LOW (ref 1.7–2.4)

## 2018-05-11 LAB — POC OCCULT BLOOD, ED: Fecal Occult Bld: POSITIVE — AB

## 2018-05-11 LAB — TROPONIN I: Troponin I: <0.03 ng/mL (ref <0.03)

## 2018-05-11 LAB — PHOSPHORUS: Phosphorus: 3.2 mg/dL (ref 2.5–4.6)

## 2018-05-11 MED ORDER — LACTATED RINGERS IV BOLUS
1000.0000 mL | Freq: Once | INTRAVENOUS | Status: AC
  Administered 2018-05-11: 1000 mL via INTRAVENOUS

## 2018-05-11 MED ORDER — ONDANSETRON HCL 4 MG/2ML IJ SOLN: 4.0000 mg | Freq: Four times a day (QID) | INTRAMUSCULAR | Status: DC | PRN

## 2018-05-11 MED ORDER — SODIUM CHLORIDE 0.9 % IV SOLN
80.0000 mg | Freq: Once | INTRAVENOUS | Status: AC
  Administered 2018-05-11: 80 mg via INTRAVENOUS
  Filled 2018-05-11: qty 80

## 2018-05-11 MED ORDER — LORAZEPAM 2 MG/ML IJ SOLN: 2.0000 mg | INTRAMUSCULAR | Status: DC | PRN

## 2018-05-11 MED ORDER — PANTOPRAZOLE SODIUM 40 MG IV SOLR
INTRAVENOUS | Status: AC
  Filled 2018-05-11: qty 160

## 2018-05-11 MED ORDER — SODIUM CHLORIDE 0.9% FLUSH
3.0000 mL | Freq: Two times a day (BID) | INTRAVENOUS | Status: DC
  Administered 2018-05-12 – 2018-05-15 (×6): 3 mL via INTRAVENOUS

## 2018-05-11 MED ORDER — SODIUM CHLORIDE 0.9 % IV BOLUS
1000.0000 mL | Freq: Once | INTRAVENOUS | Status: AC
  Administered 2018-05-11: 1000 mL via INTRAVENOUS

## 2018-05-11 MED ORDER — THIAMINE HCL 100 MG/ML IJ SOLN
INTRAVENOUS | Status: DC
  Administered 2018-05-12: via INTRAVENOUS
  Filled 2018-05-11: qty 1000

## 2018-05-11 MED ORDER — FOLIC ACID 5 MG/ML IJ SOLN
1.0000 mg | Freq: Once | INTRAMUSCULAR | Status: AC
  Administered 2018-05-11: 1 mg via INTRAVENOUS
  Filled 2018-05-11: qty 0.2

## 2018-05-11 MED ORDER — MAGNESIUM SULFATE 2 GM/50ML IV SOLN
2.0000 g | Freq: Once | INTRAVENOUS | Status: AC
  Administered 2018-05-12: 2 g via INTRAVENOUS
  Filled 2018-05-11: qty 50

## 2018-05-11 MED ORDER — ONDANSETRON HCL 4 MG PO TABS: 4.0000 mg | ORAL_TABLET | Freq: Four times a day (QID) | ORAL | Status: DC | PRN

## 2018-05-11 MED ORDER — SODIUM CHLORIDE 0.9 % IV SOLN
INTRAVENOUS | Status: DC
  Administered 2018-05-12 – 2018-05-14 (×5): via INTRAVENOUS

## 2018-05-11 MED ORDER — SODIUM CHLORIDE 0.9 % IV SOLN
8.0000 mg/h | INTRAVENOUS | Status: DC
  Administered 2018-05-11 – 2018-05-13 (×5): 8 mg/h via INTRAVENOUS
  Filled 2018-05-11 (×13): qty 80

## 2018-05-11 MED ORDER — THIAMINE HCL 100 MG/ML IJ SOLN
100.0000 mg | Freq: Once | INTRAMUSCULAR | Status: AC
  Administered 2018-05-11: 100 mg via INTRAVENOUS
  Filled 2018-05-11: qty 2

## 2018-05-11 MED ORDER — CHLORHEXIDINE GLUCONATE CLOTH 2 % EX PADS
6.0000 | MEDICATED_PAD | Freq: Every day | CUTANEOUS | Status: DC
  Administered 2018-05-12 – 2018-05-13 (×2): 6 via TOPICAL

## 2018-05-11 MED ORDER — LORAZEPAM 2 MG/ML IJ SOLN
1.0000 mg | Freq: Once | INTRAMUSCULAR | Status: AC
  Administered 2018-05-11: 1 mg via INTRAVENOUS
  Filled 2018-05-11: qty 1

## 2018-05-11 NOTE — H&P (Signed)
History and Physical    NAITHEN SCHUPPE EYC:[id number] DOB: 1955/09/27 DOA: 05/11/2018  PCP: Gareth Morgan, MD   Patient coming from: Home.  I have personally briefly reviewed patient's old medical records in [facility]  Chief Complaint: Bloody diarrhea and two syncopal episode.  HPI: Marco Stevenson is a 63 y.o. male with medical history significant of GERD, peptic ulcer disease, history of previous GI bleed, hypertension who is coming to the emergency department due to having melanotic, bloody diarrhea and 2 syncopal episodes.  He hit his left frontal area after 1 of those falls.  He denies abdominal pain, nausea, hematemesis or constipation.  No dysuria, frequency or hematuria.  He denies chest pain, dyspnea, palpitations, diaphoresis, but state he has felt lightheaded.  No PND, orthopnea or pitting edema of the lower extremities.  He denies polyuria, polydipsia, polyphagia or blurred vision.  Denies skin rashes or pruritus.  He also stated that he has been drinking a lot more than usual after his brother passed away 2 months ago.  He feels depressed, but denies homicidal or suicidal ideation.  ED Course: Initial vital signs are temperature 97.8 F, pulse 104, respirations 14, blood pressure 157/135 mmHg and O2 sat 98% on room air.  Repeat blood pressures have been more consistent with systolic ranging from 97-124 and systolic from 68 to 87 mmHg.  White count was 14.4, hemoglobin 12.2 g/dL and platelets 149.  PT and INR was normal.  Fecal occult blood was positive.  CMP showed a CO2 of 20 mmol/L.  BUN was 27, creatinine 0.86, magnesium was 1.6, phosphorus 3.2 and calcium 8.2 mg/dL.  Total protein was 5.9 and albumin 3.3 g/dL.  Transaminases and bilirubin were normal.  Lipase and troponin within normal limits.  His head CT was normal.  Review of Systems: As per HPI otherwise 10 point review of systems negative  Past Medical History:  Diagnosis Date  . Acid reflux   . Hypertension      Past Surgical History:  Procedure Laterality Date  . HERNIA REPAIR       reports that he has never smoked. He has never used smokeless tobacco. He reports current alcohol use. He reports that he does not use drugs.  No Known Allergies  Family History  Problem Relation Age of Onset  . Hypertension Father    Prior to Admission medications   Medication Sig Start Date End Date Taking? Authorizing Provider  ALPRAZolam Prudy Feeler) 1 MG tablet Take 0.5-1 mg by mouth 3 (three) times a week. At bedtime depending on work schedule 03/13/18  Yes [provider]  losartan (COZAAR) 100 MG tablet Take 100 mg by mouth daily.  04/08/18  Yes [provider]  pantoprazole (PROTONIX) 40 MG tablet Take 40 mg by mouth daily.   Yes [provider]    Physical Exam: Vitals:   05/11/18 2215 05/11/18 2230 05/11/18 2316 05/11/18 2325  BP:   (!) 125/52   Pulse: (!) 121 (!) 115 (!) 103   Resp: (!) 21 (!) 21 (!) 23   Temp:   97.9 F (36.6 C)   TempSrc:   Oral   SpO2: 100% 99% 99%   Weight:    96.3 kg  Height:    5\' 10"  (1.778 m)    Constitutional: NAD, calm, comfortable Eyes: PERRL, lids and conjunctivae mildly pale. ENMT: Mucous membranes are dry. Posterior pharynx clear of any exudate or lesions. Neck: normal, supple, no masses, no thyromegaly Respiratory: clear to auscultation  bilaterally, no wheezing, no crackles. Normal respiratory effort. No accessory muscle use.  Cardiovascular: Tachycardic at 110 bpm, no murmurs / rubs / gallops. No extremity edema. 2+ pedal pulses. No carotid bruits.  Abdomen: Soft, no tenderness, no masses palpated. No hepatosplenomegaly. Bowel sounds positive.  Musculoskeletal: no clubbing / cyanosis.  Good ROM, no contractures. Normal muscle tone.  Skin: Ecchymosis and edema on left frontal area, otherwise no rashes, lesions, ulcers on limited dermatological examination. Neurologic: CN 2-12 grossly intact. Sensation intact, DTR normal. Strength  5/5 in all 4.  Psychiatric: Normal judgment and insight. Alert and oriented x 3. Normal mood.   Labs on Admission: I have personally reviewed following labs and imaging studies  CBC: Recent Labs  Lab 05/11/18 2020  WBC 14.4*  NEUTROABS 10.5*  HGB 12.2*  HCT 35.7*  MCV 91.8  PLT 258   Basic Metabolic Panel: Recent Labs  Lab 05/11/18 2020  NA 137  K 3.9  CL 108  CO2 20*  GLUCOSE 179*  BUN 27*  CREATININE 0.86  CALCIUM 8.2*  MG 1.6*  PHOS 3.2   GFR: Estimated Creatinine Clearance: 106.3 mL/min (by C-G formula based on SCr of 0.86 mg/dL). Liver Function Tests: Recent Labs  Lab 05/11/18 2020  AST 22  ALT 28  ALKPHOS 50  BILITOT 0.7  PROT 5.9*  ALBUMIN 3.3*   Recent Labs  Lab 05/11/18 2020  LIPASE 33   No results for input(s): AMMONIA in the last 168 hours. Coagulation Profile: Recent Labs  Lab 05/11/18 2020  INR 1.1   Cardiac Enzymes: Recent Labs  Lab 05/11/18 2020  TROPONINI <0.03   BNP (last 3 results) No results for input(s): PROBNP in the last 8760 hours. HbA1C: No results for input(s): HGBA1C in the last 72 hours. CBG: Recent Labs  Lab 05/11/18 2029  GLUCAP 166*   Lipid Profile: No results for input(s): CHOL, HDL, LDLCALC, TRIG, CHOLHDL, LDLDIRECT in the last 72 hours. Thyroid Function Tests: No results for input(s): TSH, T4TOTAL, FREET4, T3FREE, THYROIDAB in the last 72 hours. Anemia Panel: No results for input(s): VITAMINB12, FOLATE, FERRITIN, TIBC, IRON, RETICCTPCT in the last 72 hours. Urine analysis: No results found for: COLORURINE, APPEARANCEUR, LABSPEC, PHURINE, GLUCOSEU, HGBUR, BILIRUBINUR, KETONESUR, PROTEINUR, UROBILINOGEN, NITRITE, LEUKOCYTESUR  Radiological Exams on Admission: Ct Head Wo Contrast  Result Date: 05/11/2018 CLINICAL DATA:  Head trauma EXAM: CT HEAD WITHOUT CONTRAST TECHNIQUE: Contiguous axial images were obtained from the base of the skull through the vertex without intravenous contrast. COMPARISON:  None.  FINDINGS: Brain: There is no mass, hemorrhage or extra-axial collection. The size and configuration of the ventricles and extra-axial CSF spaces are normal. The brain parenchyma is normal, without acute or chronic infarction. Vascular: No abnormal hyperdensity of the major intracranial arteries or dural venous sinuses. No intracranial atherosclerosis. Skull: The visualized skull base, calvarium and extracranial soft tissues are normal. Sinuses/Orbits: No fluid levels or advanced mucosal thickening of the visualized paranasal sinuses. No mastoid or middle ear effusion. The orbits are normal. IMPRESSION: Normal head CT. Electronically Signed   By: Deatra Robinson M.D.   On: 05/11/2018 23:10    EKG: Independently reviewed.  Vent. rate 106 BPM PR interval * ms QRS duration 101 ms QT/QTc 330/439 ms P-R-T axes 18 28 -1 Sinus tachycardia Low voltage, precordial leads Abnormal R-wave progression, early transition Borderline T abnormalities, inferior leads increased rate and nonspecific st/ts  Assessment/Plan Principal Problem:   UGI bleed    Acute blood loss anemia Admit to stepdown/inpatient. Keep  n.p.o. Continue IV fluids. Continue Protonix infusion. Monitor hematocrit and hemoglobin.. Transfuse as needed. GI will evaluate later today.  Active Problems:   Syncopal episodes Repeat troponin with morning labs. Check EKG in the morning. Check echocardiogram.    ETOH abuse CIWA protocol. MVI, folate and thiamine supplementation. Magnesium sulfate 2 g IVPB given.    Hypertension Monitor blood pressure. Hold losartan due to syncopal episodes.    Acid reflux On Protonix infusion.    Hypomagnesemia Supplemented. Follow-up level as needed.    DVT prophylaxis: SCDs. Code Status: Full code. Family Communication: Disposition Plan: Admit for H&H monitoring, GI consult and syncopal episode work-up. Consults called: Routine GI consult. Admission status: Inpatient/stepdown.   Bobette Mo MD [facility]  05/11/2018, 11:59 PM   This document was prepared using Dragon voice recognition software and may contain some unintended transcription errors.

## 2018-05-11 NOTE — ED Provider Notes (Signed)
[facility] EMERGENCY DEPARTMENT Provider Note   CSN: [id number] Arrival date & time: 05/11/18  1932    History   Chief Complaint Chief Complaint  Patient presents with   GI Bleeding    HPI Marco Stevenson is a 63 y.o. male.  He said he has a history of stomach ulcer.  His brother died a month or 2 ago, the patient is been very depressed and has been drinking harder than normal.  He said today he felt very weak and he thinks he passed out twice.  He hit his head during 1 of those events.  He said he passed black bowel movements and had some red on the toilet paper twice.  Denies any chest pain or shortness of breath.  Feels generally weak.  Has some nausea but no vomiting.  No urinary symptoms.  No headache.  No numbness or focal weakness.     The history is provided by the patient.  Rectal Bleeding  Quality:  Black and tarry and bright red Amount:  Moderate Duration:  1 day Timing: x2. Chronicity:  New Context: defecation   Similar prior episodes: yes   Relieved by:  None tried Worsened by:  Nothing Ineffective treatments:  None tried Associated symptoms: dizziness, light-headedness and loss of consciousness   Associated symptoms: no abdominal pain, no epistaxis, no fever, no hematemesis, no recent illness and no vomiting   Risk factors: NSAID use     Past Medical History:  Diagnosis Date   Acid reflux    Hypertension     There are no active problems to display for this patient.   Past Surgical History:  Procedure Laterality Date   HERNIA REPAIR          Home Medications    Prior to Admission medications   Medication Sig Start Date End Date Taking? Authorizing Provider  HYDROcodone-acetaminophen (NORCO/VICODIN) 5-325 MG per tablet Take 2 tablets by mouth every 4 (four) hours as needed for pain. Patient not taking: Reported on 04/10/2015 06/03/12   [name], MD  ibuprofen (ADVIL,MOTRIN) 200 MG tablet Take 400 mg by mouth every 4 (four) hours as  needed for pain.    [provider]  losartan (COZAAR) 50 MG tablet Take 50 mg by mouth daily.    [provider]  Multiple Vitamins-Minerals (MULTIVITAMIN WITH MINERALS) tablet Take 1 tablet by mouth daily.    [provider]  pantoprazole (PROTONIX) 40 MG tablet Take 40 mg by mouth daily.    [provider]    Family History No family history on file.  Social History Social History   Tobacco Use   Smoking status: Never Smoker   Smokeless tobacco: Never Used  Substance Use Topics   Alcohol use: Yes    Comment: occ   Drug use: No     Allergies   Patient has no known allergies.   Review of Systems Review of Systems  Constitutional: Positive for fatigue. Negative for fever.  HENT: Negative for nosebleeds and sore throat.   Eyes: Negative for visual disturbance.  Respiratory: Negative for shortness of breath.   Cardiovascular: Negative for chest pain.  Gastrointestinal: Positive for hematochezia. Negative for abdominal pain, hematemesis and vomiting.  Genitourinary: Negative for dysuria.  Musculoskeletal: Negative for neck pain.  Skin: Negative for rash.  Neurological: Positive for dizziness, loss of consciousness, syncope and light-headedness.     Physical Exam Updated Vital Signs BP (!) 157/135 (BP Location: Right Arm)    Pulse Marland Kitchen)  113    Temp 97.8 F (36.6 C) (Oral)    Resp 15    Ht  (1.778 m)    Wt 90.7 kg    SpO2 (!) 83%    BMI 28.70 kg/m   Physical Exam Vitals signs and nursing note reviewed.  Constitutional:      Appearance: He is well-developed. He is diaphoretic.  HENT:     Head: Normocephalic.     Comments: Slight bruise on the left side of his forehead.  No facial tenderness or crepitus. Eyes:     Conjunctiva/sclera: Conjunctivae normal.  Neck:     Musculoskeletal: Neck supple.  Cardiovascular:     Rate and Rhythm: Regular rhythm. Tachycardia present.     Heart sounds: No murmur.  Pulmonary:      Effort: Pulmonary effort is normal. No respiratory distress.     Breath sounds: Normal breath sounds.  Abdominal:     Palpations: Abdomen is soft.     Tenderness: There is no abdominal tenderness. There is no guarding.     Hernia: No hernia is present.  Musculoskeletal: Normal range of motion.        General: No signs of injury.     Right lower leg: No edema.     Left lower leg: No edema.  Skin:    General: Skin is warm.     Capillary Refill: Capillary refill takes less than 2 seconds.  Neurological:     General: No focal deficit present.     Mental Status: He is alert and oriented to person, place, and time.     Sensory: No sensory deficit.     Motor: No weakness.      ED Treatments / Results  Labs (all labs ordered are listed, but only abnormal results are displayed) Labs Reviewed  COMPREHENSIVE METABOLIC PANEL - Abnormal; Notable for the following components:      Result Value   CO2 20 (*)    Glucose, Bld 179 (*)    BUN 27 (*)    Calcium 8.2 (*)    Total Protein 5.9 (*)    Albumin 3.3 (*)    All other components within normal limits  CBC WITH DIFFERENTIAL/PLATELET - Abnormal; Notable for the following components:   WBC 14.4 (*)    RBC 3.89 (*)    Hemoglobin 12.2 (*)    HCT 35.7 (*)    Neutro Abs 10.5 (*)    Monocytes Absolute 1.5 (*)    All other components within normal limits  CBC - Abnormal; Notable for the following components:   RBC 3.12 (*)    Hemoglobin 9.9 (*)    HCT 28.8 (*)    All other components within normal limits  COMPREHENSIVE METABOLIC PANEL - Abnormal; Notable for the following components:   CO2 21 (*)    Glucose, Bld 104 (*)    BUN 32 (*)    Calcium 7.6 (*)    Total Protein 5.0 (*)    Albumin 2.9 (*)    Alkaline Phosphatase 37 (*)    All other components within normal limits  MAGNESIUM - Abnormal; Notable for the following components:   Magnesium 1.6 (*)    All other components within normal limits  POC OCCULT BLOOD, ED - Abnormal;  Notable for the following components:   Fecal Occult Bld POSITIVE (*)    All other components within normal limits  CBG MONITORING, ED - Abnormal; Notable for the following components:   Glucose-Capillary 166 (*)  All other components within normal limits  MRSA PCR SCREENING  PROTIME-INR  LIPASE, BLOOD  TROPONIN I  ETHANOL  PHOSPHORUS  TROPONIN I  GLUCOSE, CAPILLARY  HIV ANTIBODY (ROUTINE TESTING W REFLEX)  TYPE AND SCREEN    EKG EKG Interpretation  Date/Time:  Monday May 11 2018 19:40:50 EDT Ventricular Rate:  106 PR Interval:    QRS Duration: 101 QT Interval:  330 QTC Calculation: 439 R Axis:   28 Text Interpretation:  Sinus tachycardia Low voltage, precordial leads Abnormal R-wave progression, early transition Borderline T abnormalities, inferior leads increased rate and nonspecific st/ts compared with prior 2/17 Confirmed by Meridee Score [phone]) on 05/11/2018 8:12:12 PM   Radiology Ct Head Wo Contrast  Result Date: 05/11/2018 CLINICAL DATA:  Head trauma EXAM: CT HEAD WITHOUT CONTRAST TECHNIQUE: Contiguous axial images were obtained from the base of the skull through the vertex without intravenous contrast. COMPARISON:  None. FINDINGS: Brain: There is no mass, hemorrhage or extra-axial collection. The size and configuration of the ventricles and extra-axial CSF spaces are normal. The brain parenchyma is normal, without acute or chronic infarction. Vascular: No abnormal hyperdensity of the major intracranial arteries or dural venous sinuses. No intracranial atherosclerosis. Skull: The visualized skull base, calvarium and extracranial soft tissues are normal. Sinuses/Orbits: No fluid levels or advanced mucosal thickening of the visualized paranasal sinuses. No mastoid or middle ear effusion. The orbits are normal. IMPRESSION: Normal head CT. Electronically Signed   By: Deatra Robinson M.D.   On: 05/11/2018 23:10    Procedures Procedures (including critical care  time)  Medications Ordered in ED Medications  pantoprazole (PROTONIX) 80 mg in sodium chloride 0.9 % 250 mL (0.32 mg/mL) infusion (8 mg/hr Intravenous New Bag/Given 05/12/18 0831)  Chlorhexidine Gluconate Cloth 2 % PADS 6 each (6 each Topical Given 05/12/18 0446)  ondansetron (ZOFRAN) tablet 4 mg (has no administration in time range)    Or  ondansetron (ZOFRAN) injection 4 mg (has no administration in time range)  sodium chloride flush (NS) 0.9 % injection 3 mL (3 mLs Intravenous Not Given 05/12/18 0854)  0.9 %  sodium chloride infusion ( Intravenous New Bag/Given 05/12/18 0008)  LORazepam (ATIVAN) injection 2-3 mg (has no administration in time range)  sodium chloride 0.9 % bolus 1,000 mL (0 mLs Intravenous Stopped 05/11/18 2136)  pantoprazole (PROTONIX) 80 mg in sodium chloride 0.9 % 100 mL IVPB (0 mg Intravenous Stopped 05/11/18 2137)  LORazepam (ATIVAN) injection 1 mg (1 mg Intravenous Given 05/11/18 2134)  thiamine (B-1) injection 100 mg (100 mg Intravenous Given 05/11/18 2137)  folic acid injection 1 mg (1 mg Intravenous Given 05/11/18 2241)  lactated ringers bolus 1,000 mL (0 mLs Intravenous Stopping Infusion hung by another clincian 05/12/18 0011)  magnesium sulfate IVPB 2 g 50 mL (0 g Intravenous Stopped 05/12/18 0120)     Initial Impression / Assessment and Plan / ED Course  I have reviewed the triage vital signs and the nursing notes.  Pertinent labs & imaging results that were available during my care of the patient were reviewed by me and considered in my medical decision making (see chart for details).  Clinical Course as of May 11 1113  Mon May 11, 2018  2031 Rectal exam done with patient's nurse as chaperone.  Normal sphincter tone no masses.  Sent for guaiac.   [MB]  2031 Differential includes GI bleed, alcohol withdrawal, pancreatitis, ACS, metabolic derangement.   [MB]  2114 I updated the patient on his results.  He said  he had an endoscopy about 10 years ago and they found  that he had some bleeding from his distal esophagus and they cauterized that.  They blamed that on acid reflux at the time.   [MB]  2115  Patient's lab back and he is not more anemic than his baseline although he has not had blood work for 3 years.  BUN is also slightly elevated which would fit with possible GI bleed.  Heme positive from below.  Troponin and lipase are normal.  Alcohol test is negative.  I put a call into GI on-call for recommendations.  Currently he is received some IV fluids and a Protonix drip and he is feeling better and less tachycardic.  I have ordered him some thiamine and folate and some Ativan although he is not exhibiting any signs of withdrawal at the moment.   [MB]  2147 Discussed with Dr. Karilyn Cota from GI.  He asked that the patient be kept n.p.o. and he anticipates endoscopy in the morning.  Recommended to consider CIWA.   [MB]  2209 Discussed with [facility] hospital[facility] Dr. Robb Matar.  He asked if I could put him in for a head CT to make sure the syncope head injury did not do anything more serious.   [MB]    Clinical Course User Index [MB] Terrilee Files, MD       Final Clinical Impressions(s) / ED Diagnoses   Final diagnoses:  Gastrointestinal hemorrhage, unspecified gastrointestinal hemorrhage type  Syncope, unspecified syncope type  Injury of head, initial encounter  Uncomplicated alcohol dependence [facility])    ED Discharge Orders    None       Terrilee Files, MD 05/12/18 [id number]

## 2018-05-11 NOTE — ED Triage Notes (Addendum)
[facility] - pt c/o of bloody diarrhea and syncope x2. Last syncopal episode being 2 hours ago. Hit head with one of syncopal episodes. Pt states he is feeling now as if he will pass out again. ETOH on board

## 2018-05-12 ENCOUNTER — Inpatient Hospital Stay (HOSPITAL_COMMUNITY): Payer: Commercial Managed Care - PPO | Admitting: Anesthesiology

## 2018-05-12 ENCOUNTER — Encounter (HOSPITAL_COMMUNITY)
Admission: EM | Disposition: A | Discharge status: Home / Self Care | Payer: Self-pay | Source: Home / Self Care | Attending: Internal Medicine

## 2018-05-12 ENCOUNTER — Encounter (HOSPITAL_COMMUNITY): Payer: Self-pay | Admitting: Gastroenterology

## 2018-05-12 ENCOUNTER — Other Ambulatory Visit (HOSPITAL_COMMUNITY): Payer: Commercial Managed Care - PPO

## 2018-05-12 DIAGNOSIS — R55 Syncope and collapse: Secondary | ICD-10-CM | POA: Diagnosis present

## 2018-05-12 DIAGNOSIS — K317 Polyp of stomach and duodenum: Secondary | ICD-10-CM

## 2018-05-12 DIAGNOSIS — F1010 Alcohol abuse, uncomplicated: Secondary | ICD-10-CM

## 2018-05-12 DIAGNOSIS — D62 Acute posthemorrhagic anemia: Secondary | ICD-10-CM

## 2018-05-12 DIAGNOSIS — K219 Gastro-esophageal reflux disease without esophagitis: Secondary | ICD-10-CM

## 2018-05-12 DIAGNOSIS — I10 Essential (primary) hypertension: Secondary | ICD-10-CM

## 2018-05-12 DIAGNOSIS — K259 Gastric ulcer, unspecified as acute or chronic, without hemorrhage or perforation: Secondary | ICD-10-CM

## 2018-05-12 DIAGNOSIS — K921 Melena: Secondary | ICD-10-CM

## 2018-05-12 HISTORY — PX: ESOPHAGOGASTRODUODENOSCOPY (EGD) WITH PROPOFOL: SHX5813

## 2018-05-12 HISTORY — PX: POLYPECTOMY: SHX5525

## 2018-05-12 LAB — HEMOGLOBIN AND HEMATOCRIT, BLOOD
HCT: 28.5 % — ABNORMAL LOW (ref 39.0–52.0)
Hemoglobin: 9.4 g/dL — ABNORMAL LOW (ref 13.0–17.0)

## 2018-05-12 LAB — CBC
HCT: 28.8 % — ABNORMAL LOW (ref 39.0–52.0)
Hemoglobin: 9.9 g/dL — ABNORMAL LOW (ref 13.0–17.0)
MCH: 31.7 pg (ref 26.0–34.0)
MCHC: 34.4 g/dL (ref 30.0–36.0)
MCV: 92.3 fL (ref 80.0–100.0)
Platelets: 173 10*3/uL (ref 150–400)
RBC: 3.12 MIL/uL — ABNORMAL LOW (ref 4.22–5.81)
RDW: 12.6 % (ref 11.5–15.5)
WBC: 9.7 10*3/uL (ref 4.0–10.5)
nRBC: 0 % (ref 0.0–0.2)

## 2018-05-12 LAB — COMPREHENSIVE METABOLIC PANEL
ALT: 23 U/L (ref 0–44)
AST: 19 U/L (ref 15–41)
Albumin: 2.9 g/dL — ABNORMAL LOW (ref 3.5–5.0)
Alkaline Phosphatase: 37 U/L — ABNORMAL LOW (ref 38–126)
Anion gap: 6 (ref 5–15)
BUN: 32 mg/dL — ABNORMAL HIGH (ref 6–20)
CO2: 21 mmol/L — ABNORMAL LOW (ref 22–32)
Calcium: 7.6 mg/dL — ABNORMAL LOW (ref 8.9–10.3)
Chloride: 111 mmol/L (ref 98–111)
Creatinine, Ser: 0.64 mg/dL (ref 0.61–1.24)
GFR calc Af Amer: >60 mL/min (ref >60)
GFR calc non Af Amer: >60 mL/min (ref >60)
Glucose, Bld: 104 mg/dL — ABNORMAL HIGH (ref 70–99)
Potassium: 3.9 mmol/L (ref 3.5–5.1)
Sodium: 138 mmol/L (ref 135–145)
Total Bilirubin: 0.4 mg/dL (ref 0.3–1.2)
Total Protein: 5 g/dL — ABNORMAL LOW (ref 6.5–8.1)

## 2018-05-12 LAB — MRSA PCR SCREENING: MRSA by PCR: NEGATIVE

## 2018-05-12 LAB — TROPONIN I: Troponin I: <0.03 ng/mL (ref <0.03)

## 2018-05-12 LAB — GLUCOSE, CAPILLARY: Glucose-Capillary: 88 mg/dL (ref 70–99)

## 2018-05-12 SURGERY — ESOPHAGOGASTRODUODENOSCOPY (EGD) WITH PROPOFOL
Anesthesia: Monitor Anesthesia Care

## 2018-05-12 MED ORDER — PROPOFOL 10 MG/ML IV BOLUS
INTRAVENOUS | Status: DC | PRN
  Administered 2018-05-12 (×2): 20 mg via INTRAVENOUS

## 2018-05-12 MED ORDER — THIAMINE HCL 100 MG/ML IJ SOLN
INTRAMUSCULAR | Status: AC
  Filled 2018-05-12: qty 2

## 2018-05-12 MED ORDER — PROPOFOL 500 MG/50ML IV EMUL
INTRAVENOUS | Status: DC | PRN
  Administered 2018-05-12: 125 ug/kg/min via INTRAVENOUS
  Administered 2018-05-12: 14:00:00 via INTRAVENOUS

## 2018-05-12 MED ORDER — LACTATED RINGERS IV SOLN: INTRAVENOUS | Status: DC

## 2018-05-12 MED ORDER — PROMETHAZINE HCL 25 MG/ML IJ SOLN: 6.2500 mg | INTRAMUSCULAR | Status: DC | PRN

## 2018-05-12 MED ORDER — HYDROCODONE-ACETAMINOPHEN 7.5-325 MG PO TABS: 1.0000 | ORAL_TABLET | Freq: Once | ORAL | Status: DC | PRN

## 2018-05-12 MED ORDER — LIDOCAINE VISCOUS HCL 2 % MT SOLN
OROMUCOSAL | Status: DC | PRN
  Administered 2018-05-12: 5 mL via OROMUCOSAL

## 2018-05-12 MED ORDER — SODIUM CHLORIDE (PF) 0.9 % IJ SOLN
PREFILLED_SYRINGE | INTRAMUSCULAR | Status: DC | PRN
  Administered 2018-05-12: 2 mL

## 2018-05-12 MED ORDER — M.V.I. ADULT IV INJ
INJECTION | INTRAVENOUS | Status: AC
  Filled 2018-05-12: qty 10

## 2018-05-12 MED ORDER — STERILE WATER FOR IRRIGATION IR SOLN
Status: DC | PRN
  Administered 2018-05-12: 100 mL

## 2018-05-12 MED ORDER — LACTATED RINGERS IV SOLN: INTRAVENOUS | Status: DC | PRN

## 2018-05-12 MED ORDER — HYDROMORPHONE HCL 1 MG/ML IJ SOLN
INTRAMUSCULAR | Status: AC
  Filled 2018-05-12: qty 0.5

## 2018-05-12 MED ORDER — EPINEPHRINE PF 1 MG/10ML IJ SOSY
PREFILLED_SYRINGE | INTRAMUSCULAR | Status: AC
  Filled 2018-05-12: qty 10

## 2018-05-12 MED ORDER — MEPERIDINE HCL 100 MG/ML IJ SOLN: 6.2500 mg | INTRAMUSCULAR | Status: DC | PRN

## 2018-05-12 MED ORDER — HYDROMORPHONE HCL 1 MG/ML IJ SOLN
0.2500 mg | INTRAMUSCULAR | Status: DC | PRN
  Administered 2018-05-12: 0.5 mg via INTRAVENOUS

## 2018-05-12 NOTE — Transfer of Care (Signed)
Immediate Anesthesia Transfer of Care Note  Patient: Marco Stevenson  Procedure(s) Performed: ESOPHAGOGASTRODUODENOSCOPY (EGD) WITH PROPOFOL (N/A ) POLYPECTOMY  Patient Location: PACU  Anesthesia Type:MAC  Level of Consciousness: awake and patient cooperative  Airway & Oxygen Therapy: Patient Spontanous Breathing and Patient connected to nasal cannula oxygen  Post-op Assessment: Report given to RN, Post -op Vital signs reviewed and stable and Patient moving all extremities  Post vital signs: Reviewed and stable  Last Vitals:  Vitals Value Taken Time  BP    Temp    Pulse    Resp    SpO2      Last Pain:  Vitals:   05/12/18 1410  TempSrc:   PainSc: 0-No pain      Patients Stated Pain Goal: 7 (87/19/59 7471)  Complications: No apparent anesthesia complications

## 2018-05-12 NOTE — Progress Notes (Addendum)
Marco Stevenson is a 63 y.o. male patient admitted from [facility] awake, alert - oriented  X 4 - no acute distress noted.  IV in place, occlusive dsg intact without redness.  Orientation to room, and floor completed with information packet given to patient/family.  Patient declined safety video at this time.  Admission INP armband ID verified with patient/family, and in place.   SR up x 2, fall assessment complete, with patient and family able to verbalize understanding of risk associated with falls, and verbalized understanding to call nsg before up out of bed.  Call light within reach, patient able to voice, and demonstrate understanding.  Skin, clean-dry- intact without evidence of bruising, or skin tears.   No evidence of skin break down noted on exam.     Will cont to eval and treat per MD orders.  Jeralene Peters, RN 05/12/2018 6:02 PM

## 2018-05-12 NOTE — Progress Notes (Signed)
   Due to the COVID-19 pandemic, [facility] is committed to reducing the risk of viral transmission, patient, provider and staff exposure by limiting procedures considered essential to acute management.  After a thorough review of the chart, we have cancelled the scheduled echocardiographic procedure for the following reasons:  Syncope better explained by ABLA, UGI bleed, etoh abuse Troponin negative x 2 Outpatient echo is reasonable  Thank you for your understanding. Please reach out to [facility] if you have any further questions.  Chrystie Nose, MD, [facility], FACP  [place]  [facility]  Medical Director of the [facility] Lipid Disorders &  Cardiovascular Risk Reduction Clinic Diplomate of the American Board of Clinical Lipidology Attending Cardiologist  Direct Dial: [phone]  Fax: [phone]  Website:  [email or web address]

## 2018-05-12 NOTE — Progress Notes (Signed)
Report given to Salvadore Oxford, RN on 5W

## 2018-05-12 NOTE — Consult Note (Signed)
Referring Provider: Dr. Robb Matar Primary Care Physician:  Gareth Morgan, MD Primary Gastroenterologist:  Dr. Jena Gauss  Date of Admission: 05/11/18 Date of Consultation: 05/12/18  Reason for Consultation: Melena  HPI:  Marco Stevenson is a very pleasant 63 y.o. year old male presenting to the ED yesterday with acute onset GI bleeding. Due to weakness and syncopal episodes, presented to the ED. Head CT without contrast performed yesterday due to reports of hitting head while passing out, which was normal.  Admitting Hgb yesterday 12.2, now 9.9. Unknown baseline but last CBC on file 3 years ago with Hgb 16. Protonix infusion started.  States he felt fine yesterday, got home around 530 pm. Felt weak on way home from work. Blacked out 3 times. Onset of pitch black stool after syncopal episodes. Has had 4 stools since admission, with last 10 minutes ago, dark black. No N/V, no abdominal pain. No dysphagia. Chronic GERD on Protonix 40 mg daily. No lack of appetite or unexplained weight loss. Admits to taking large amounts of Ibuprofen a few months ago but none in about a month. Has been grieving brother's death in 07-12-19and increased ETOH intake, noting about 4-5 shots of tequila a day. Occasional beer. Reports GI bleed in 2007 with admission and undergoing EGD. Stating an area had to be cauterized.   Unable to retrieve past EGD reports but CT chest/abd/pelvis with contrast in epic from 2007, performed due to abnormal EGD, showing diffuse thickening wall distal esophagus.   Past Medical History:  Diagnosis Date  . Acid reflux   . Hypertension     Past Surgical History:  Procedure Laterality Date  . HERNIA REPAIR      Prior to Admission medications   Medication Sig Start Date End Date Taking? Authorizing Provider  ALPRAZolam Prudy Feeler) 1 MG tablet Take 0.5-1 mg by mouth 3 (three) times a week. At bedtime depending on work schedule 03/13/18  Yes [provider]  losartan (COZAAR) 100  MG tablet Take 100 mg by mouth daily.  04/08/18  Yes [provider]  pantoprazole (PROTONIX) 40 MG tablet Take 40 mg by mouth daily.   Yes [provider]  sildenafil (VIAGRA) 25 MG tablet Take 25 mg by mouth as needed for erectile dysfunction.    Yes [provider]    Current Facility-Administered Medications  Medication Dose Route Frequency Provider Last Rate Last Dose  . 0.9 %  sodium chloride infusion   Intravenous Continuous Bobette Mo, MD 100 mL/hr at 05/12/18 0008    . Chlorhexidine Gluconate Cloth 2 % PADS 6 each  6 each Topical Q0600 Bobette Mo, MD   6 each at 05/12/18 [phone]  . LORazepam (ATIVAN) injection 2-3 mg  2-3 mg Intravenous Q1H PRN Bobette Mo, MD      . ondansetron [facility]) tablet 4 mg  4 mg Oral Q6H PRN Bobette Mo, MD       Or  . ondansetron [facility]) injection 4 mg  4 mg Intravenous Q6H PRN Bobette Mo, MD      . pantoprazole (PROTONIX) 80 mg in sodium chloride 0.9 % 250 mL (0.32 mg/mL) infusion  8 mg/hr Intravenous Continuous Bobette Mo, MD 25 mL/hr at 05/12/18 0831 8 mg/hr at 05/12/18 0831  . sodium chloride flush (NS) 0.9 % injection 3 mL  3 mL Intravenous Q12H Bobette Mo, MD   3 mL at 05/12/18 0021    Allergies as of 05/11/2018  . (No Known  Allergies)    Family History  Problem Relation Age of Onset  . Hypertension Father   . Colon cancer Neg Hx   . Colon polyps Neg Hx     Social History   Socioeconomic History  . Marital status: Divorced    Spouse name: Not on file  . Number of children: Not on file  . Years of education: Not on file  . Highest education level: Not on file  Occupational History  . Not on file  Social Needs  . Financial resource strain: Not on file  . Food insecurity:    Worry: Not on file    Inability: Not on file  . Transportation needs:    Medical: Not on file    Non-medical: Not on file  Tobacco Use  . Smoking status: Never Smoker  .  Smokeless tobacco: Never Used  Substance and Sexual Activity  . Alcohol use: Yes    Comment: 4-5 shots of tequila for past 6+ months, occasional beer   . Drug use: No  . Sexual activity: Yes  Lifestyle  . Physical activity:    Days per week: Not on file    Minutes per session: Not on file  . Stress: Not on file  Relationships  . Social connections:    Talks on phone: Not on file    Gets together: Not on file    Attends religious service: Not on file    Active member of club or organization: Not on file    Attends meetings of clubs or organizations: Not on file    Relationship status: Not on file  . Intimate partner violence:    Fear of current or ex partner: Not on file    Emotionally abused: Not on file    Physically abused: Not on file    Forced sexual activity: Not on file  Other Topics Concern  . Not on file  Social History Narrative  . Not on file    Review of Systems: Gen: see HPI  CV: Denies chest pain, heart palpitations, syncope, edema  Resp: Denies shortness of breath with rest, cough, wheezing GI: see HPI GU : Denies urinary burning, urinary frequency, urinary incontinence.  MS: Denies joint pain,swelling, cramping Derm: Denies rash, itching, dry skin Psych: Denies depression, anxiety,confusion, or memory loss Heme: see HPI  Physical Exam: Vital signs in last 24 hours: Temp:  [97.8 F (36.6 C)-98.5 F (36.9 C)] 98.5 F (36.9 C) (03/24 0437) Pulse Rate:  [80-124] 81 (03/24 0700) Resp:  [13-27] 18 (03/24 0700) BP: (97-157)/(52-135) 108/70 (03/24 0700) SpO2:  [96 %-100 %] 97 % (03/24 0700) Weight:  [90.7 kg-97.5 kg] 97.5 kg (03/24 0441) Last BM Date: 05/12/18 General:   Alert,  Well-developed, well-nourished, pleasant and cooperative in NAD Head:  Normocephalic and atraumatic. Eyes:  Sclera clear, no icterus.   Conjunctiva pink. Ears:  Normal auditory acuity. Nose:  No deformity, discharge,  or lesions. Mouth:  No deformity or lesions Lungs:  Clear  throughout to auscultation.    Heart:  S1 S2 present; no murmurs, clicks, rubs,  or gallops. Abdomen:  Soft, nontender and nondistended. No masses, hepatosplenomegaly or hernias noted. Normal bowel sounds, without guarding, and without rebound.   Rectal:  Deferred  Msk:  Symmetrical without gross deformities. Normal posture. Extremities:  Without  edema. Neurologic:  Alert and  oriented x4 Psych:  Alert and cooperative. Normal mood and affect.  Intake/Output from previous day: 03/23 0701 - 03/24 0700 In: 1141.7 [I.V.:3.2; IV  Piggyback:1138.5] Out: 1000 [Urine:1000] Intake/Output this shift: No intake/output data recorded.  Lab Results: Recent Labs    05/11/18 2020 05/12/18 0508  WBC 14.4* 9.7  HGB 12.2* 9.9*  HCT 35.7* 28.8*  PLT 258 173   BMET Recent Labs    05/11/18 2020 05/12/18 0508  NA 137 138  K 3.9 3.9  CL 108 111  CO2 20* 21*  GLUCOSE 179* 104*  BUN 27* 32*  CREATININE 0.86 0.64  CALCIUM 8.2* 7.6*   LFT Recent Labs    05/11/18 2020 05/12/18 0508  PROT 5.9* 5.0*  ALBUMIN 3.3* 2.9*  AST 22 19  ALT 28 23  ALKPHOS 50 37*  BILITOT 0.7 0.4   PT/INR Recent Labs    05/11/18 2020  LABPROT 14.0  INR 1.1    Studies/Results: Ct Head Wo Contrast  Result Date: 05/11/2018 CLINICAL DATA:  Head trauma EXAM: CT HEAD WITHOUT CONTRAST TECHNIQUE: Contiguous axial images were obtained from the base of the skull through the vertex without intravenous contrast. COMPARISON:  None. FINDINGS: Brain: There is no mass, hemorrhage or extra-axial collection. The size and configuration of the ventricles and extra-axial CSF spaces are normal. The brain parenchyma is normal, without acute or chronic infarction. Vascular: No abnormal hyperdensity of the major intracranial arteries or dural venous sinuses. No intracranial atherosclerosis. Skull: The visualized skull base, calvarium and extracranial soft tissues are normal. Sinuses/Orbits: No fluid levels or advanced mucosal  thickening of the visualized paranasal sinuses. No mastoid or middle ear effusion. The orbits are normal. IMPRESSION: Normal head CT. Electronically Signed   By: Deatra Robinson M.D.   On: 05/11/2018 23:10    Impression: 63 year old male presenting with new onset melena starting yesterday, associated weakness, fatigue, syncopal episodes in setting of acute blood loss anemia due to UGI bleed. Melena continued overnight with last episode 10 minutes ago. Almost 3 gram drop in Hgb since admission but hemodynamically stable. Endorses NSAID use several months ago but none in the past month; he has also increased alcohol intake since brother's passing June 2019 and takes 4-5 shots of tequila daily. No known liver disease.   Remote history of GI bleed in 2007 requiring hospitalization with EGD performed at that time. Unable to retreive this from epic, but a CT scan is on file from that hospitalization with diffuse distal esophageal wall thickening extending to GE junction.   Patient has been NPO except ice chips and instructed to avoid any further intake of ice chips for now. Discussed risks and benefits of EGD with stated understanding and agreeable to procedure.   Plan: NPO except sips with meds Continue PPI infusion Follow H/H EGD by Dr. Darrick Penna with Propofol to be arranged Discussed avoidance of NSAIDs and ETOH cessation going forward Elective outpatient colonoscopy for routine screening purposes   Gelene Mink, PhD, ANP-BC [facility]     LOS: 1 day    05/12/2018, 8:53 AM

## 2018-05-12 NOTE — Anesthesia Postprocedure Evaluation (Signed)
Anesthesia Post Note  Patient: Marco Stevenson  Procedure(s) Performed: ESOPHAGOGASTRODUODENOSCOPY (EGD) WITH PROPOFOL (N/A ) POLYPECTOMY  Patient location during evaluation: PACU Anesthesia Type: MAC Level of consciousness: awake and patient cooperative Pain management: pain level controlled Respiratory status: spontaneous breathing, nonlabored ventilation and respiratory function stable Cardiovascular status: blood pressure returned to baseline Postop Assessment: no apparent nausea or vomiting Anesthetic complications: no     Last Vitals:  Vitals:   05/12/18 1008 05/12/18 1210  BP: 120/72 125/85  Pulse: 82 91  Resp: 17 16  Temp:  36.7 C  SpO2: 96% 97%    Last Pain:  Vitals:   05/12/18 1410  TempSrc:   PainSc: 0-No pain                 IDACAVAGE,ROBERT J

## 2018-05-12 NOTE — Interval H&P Note (Signed)
History and Physical Interval Note:  05/12/2018 1:20 PM  Marco Stevenson  has presented today for surgery, with the diagnosis of melena, acute blood loss anemia.  The various methods of treatment have been discussed with the patient and family. After consideration of risks, benefits and other options for treatment, the patient has consented to  Procedure(s): ESOPHAGOGASTRODUODENOSCOPY (EGD) WITH PROPOFOL (N/A) as a surgical intervention.  The patient's history has been reviewed, patient examined, no change in status, stable for surgery.  I have reviewed the patient's chart and labs.  Questions were answered to the patient's satisfaction.     [employer]

## 2018-05-12 NOTE — Progress Notes (Signed)
PROGRESS NOTE  Marco Stevenson  ONG:[id number]  DOB: [birth year]  DOA: 05/11/2018 PCP: Gareth Morgan, MD  Brief Admission Hx:  63 y.o. male with medical history significant of GERD, peptic ulcer disease, history of previous GI bleed, hypertension who is coming to the emergency department due to having melanotic, bloody diarrhea and 2 syncopal episodes.   MDM/Assessment & Plan:   1. Recurrent upper GI bleeding - Pt reports several years ago he had a similar presentation and had EGD with findings of diffuse esophagitis.  He is on IV protonix.  He has had several black stools since arrival. Appreciate GI consult.   Pt remains hemodynamically stable.  2. Syncopal episodes - likely vasovagal events secondary to GI bleeding and dehydration.  He is feeling much better.  Outpatient echocardiogram.  3. ETOH abuse - CIWA protocol in place. MVI, folic acid, thiamine ordered.  4. Essential hypertension - temporarily holding losartan, follow BP, treat as needed.  5. GERD - protonix infusion.  6. Hypomagnesemia - IV replacement ordered, follow magnesium.   DVT prophylaxis: SCDs Code Status: FULL  Family Communication: patient  Disposition Plan: inpatient stepdown ICU   Consultants:  GI  Procedures:    Antimicrobials:     Subjective: Pt says he is feeling a lot better this morning, no alcohol withdrawal symptoms.   Objective: Vitals:   05/12/18 0441 05/12/18 0500 05/12/18 0600 05/12/18 0700  BP:  104/68 108/72 108/70  Pulse:  83 81 81  Resp:  19 17 18   Temp:      TempSrc:      SpO2:  97% 96% 97%  Weight: 97.5 kg     Height:        Intake/Output Summary (Last 24 hours) at 05/12/2018 0956 Last data filed at 05/12/2018 0831 Gross per 24 hour  Intake 1386.93 ml  Output 1000 ml  Net 386.93 ml   Filed Weights   05/11/18 1936 05/11/18 2325 05/12/18 0441  Weight: 90.7 kg 96.3 kg 97.5 kg   REVIEW OF SYSTEMS  As per history otherwise all reviewed and reported negative   Exam:  General exam: awake, alert, NAD, cooperative.   Respiratory system: Clear. No increased work of breathing. Cardiovascular system: S1 & S2 heard. No JVD, murmurs, gallops, clicks or pedal edema. Gastrointestinal system: Abdomen is nondistended, soft and nontender. Normal bowel sounds heard. Central nervous system: Alert and oriented. No focal neurological deficits. Extremities: no cyanosis or clubbing.  Data Reviewed: Basic Metabolic Panel: Recent Labs  Lab 05/11/18 2020 05/12/18 0508  NA 137 138  K 3.9 3.9  CL 108 111  CO2 20* 21*  GLUCOSE 179* 104*  BUN 27* 32*  CREATININE 0.86 0.64  CALCIUM 8.2* 7.6*  MG 1.6*  --   PHOS 3.2  --    Liver Function Tests: Recent Labs  Lab 05/11/18 2020 05/12/18 0508  AST 22 19  ALT 28 23  ALKPHOS 50 37*  BILITOT 0.7 0.4  PROT 5.9* 5.0*  ALBUMIN 3.3* 2.9*   Recent Labs  Lab 05/11/18 2020  LIPASE 33   No results for input(s): AMMONIA in the last 168 hours. CBC: Recent Labs  Lab 05/11/18 2020 05/12/18 0508  WBC 14.4* 9.7  NEUTROABS 10.5*  --   HGB 12.2* 9.9*  HCT 35.7* 28.8*  MCV 91.8 92.3  PLT 258 173   Cardiac Enzymes: Recent Labs  Lab 05/11/18 2020 05/12/18 0508  TROPONINI <0.03 <0.03   CBG (last 3)  Recent Labs    05/11/18 2029  05/12/18 0708  GLUCAP 166* 88   Recent Results (from the past 240 hour(s))  MRSA PCR Screening     Status: None   Collection Time: 05/11/18 11:07 PM  Result Value Ref Range Status   MRSA by PCR NEGATIVE NEGATIVE Final    Comment:        The GeneXpert MRSA Assay (FDA approved for NASAL specimens only), is one component of a comprehensive MRSA colonization surveillance program. It is not intended to diagnose MRSA infection nor to guide or monitor treatment for MRSA infections. Performed at [facility], [address], [place], [place] [address]      Studies: Ct Head Wo Contrast  Result Date: 05/11/2018 CLINICAL DATA:  Head trauma EXAM: CT HEAD WITHOUT CONTRAST  TECHNIQUE: Contiguous axial images were obtained from the base of the skull through the vertex without intravenous contrast. COMPARISON:  None. FINDINGS: Brain: There is no mass, hemorrhage or extra-axial collection. The size and configuration of the ventricles and extra-axial CSF spaces are normal. The brain parenchyma is normal, without acute or chronic infarction. Vascular: No abnormal hyperdensity of the major intracranial arteries or dural venous sinuses. No intracranial atherosclerosis. Skull: The visualized skull base, calvarium and extracranial soft tissues are normal. Sinuses/Orbits: No fluid levels or advanced mucosal thickening of the visualized paranasal sinuses. No mastoid or middle ear effusion. The orbits are normal. IMPRESSION: Normal head CT. Electronically Signed   By: Deatra Robinson M.D.   On: 05/11/2018 23:10   Scheduled Meds: . Chlorhexidine Gluconate Cloth  6 each Topical Q0600  . sodium chloride flush  3 mL Intravenous Q12H   Continuous Infusions: . sodium chloride 100 mL/hr at 05/12/18 0008  . pantoprozole (PROTONIX) infusion 8 mg/hr (05/12/18 0831)    Principal Problem:   UGI bleed Active Problems:   Hypertension   ETOH abuse   Acid reflux   Hypomagnesemia   Acute blood loss anemia   Syncopal episodes  Critical Care Time spent: 31 minutes   Standley Dakins, MD [facility] 05/12/2018, 9:56 AM    LOS: 1 day  How to contact the [facility] Attending or Consulting provider 7A - 7P or covering provider during after hours 7P -7A, for this patient?  1. Check the care team in [facility] and look for a) attending/consulting [facility] provider listed and b) the [facility] team listed 2. Log into [email or web address] and use [place]'s universal password to access. If you do not have the password, please contact the hospital operator. 3. Locate the [facility] provider you are looking for under [facility] and page to a number that you can be directly reached. 4. If you still have difficulty  reaching the provider, please page the [facility] (Director on Call) for the Hospitalists listed on amion for assistance.

## 2018-05-12 NOTE — H&P (View-Only) (Signed)
Referring Provider: Dr. Robb Matar Primary Care Physician:  Gareth Morgan, MD Primary Gastroenterologist:  Dr. Jena Gauss  Date of Admission: 05/11/18 Date of Consultation: 05/12/18  Reason for Consultation: Melena  HPI:  Marco Stevenson is a very pleasant 63 y.o. year old male presenting to the ED yesterday with acute onset GI bleeding. Due to weakness and syncopal episodes, presented to the ED. Head CT without contrast performed yesterday due to reports of hitting head while passing out, which was normal.  Admitting Hgb yesterday 12.2, now 9.9. Unknown baseline but last CBC on file 3 years ago with Hgb 16. Protonix infusion started.  States he felt fine yesterday, got home around 530 pm. Felt weak on way home from work. Blacked out 3 times. Onset of pitch black stool after syncopal episodes. Has had 4 stools since admission, with last 10 minutes ago, dark black. No N/V, no abdominal pain. No dysphagia. Chronic GERD on Protonix 40 mg daily. No lack of appetite or unexplained weight loss. Admits to taking large amounts of Ibuprofen a few months ago but none in about a month. Has been grieving brother's death in 07-12-19and increased ETOH intake, noting about 4-5 shots of tequila a day. Occasional beer. Reports GI bleed in 2007 with admission and undergoing EGD. Stating an area had to be cauterized.   Unable to retrieve past EGD reports but CT chest/abd/pelvis with contrast in epic from 2007, performed due to abnormal EGD, showing diffuse thickening wall distal esophagus.   Past Medical History:  Diagnosis Date  . Acid reflux   . Hypertension     Past Surgical History:  Procedure Laterality Date  . HERNIA REPAIR      Prior to Admission medications   Medication Sig Start Date End Date Taking? Authorizing Provider  ALPRAZolam Prudy Feeler) 1 MG tablet Take 0.5-1 mg by mouth 3 (three) times a week. At bedtime depending on work schedule 03/13/18  Yes [provider]  losartan (COZAAR) 100  MG tablet Take 100 mg by mouth daily.  04/08/18  Yes [provider]  pantoprazole (PROTONIX) 40 MG tablet Take 40 mg by mouth daily.   Yes [provider]  sildenafil (VIAGRA) 25 MG tablet Take 25 mg by mouth as needed for erectile dysfunction.    Yes [provider]    Current Facility-Administered Medications  Medication Dose Route Frequency Provider Last Rate Last Dose  . 0.9 %  sodium chloride infusion   Intravenous Continuous Bobette Mo, MD 100 mL/hr at 05/12/18 0008    . Chlorhexidine Gluconate Cloth 2 % PADS 6 each  6 each Topical Q0600 Bobette Mo, MD   6 each at 05/12/18 [phone]  . LORazepam (ATIVAN) injection 2-3 mg  2-3 mg Intravenous Q1H PRN Bobette Mo, MD      . ondansetron [facility]) tablet 4 mg  4 mg Oral Q6H PRN Bobette Mo, MD       Or  . ondansetron [facility]) injection 4 mg  4 mg Intravenous Q6H PRN Bobette Mo, MD      . pantoprazole (PROTONIX) 80 mg in sodium chloride 0.9 % 250 mL (0.32 mg/mL) infusion  8 mg/hr Intravenous Continuous Bobette Mo, MD 25 mL/hr at 05/12/18 0831 8 mg/hr at 05/12/18 0831  . sodium chloride flush (NS) 0.9 % injection 3 mL  3 mL Intravenous Q12H Bobette Mo, MD   3 mL at 05/12/18 0021    Allergies as of 05/11/2018  . (No Known  Allergies)    Family History  Problem Relation Age of Onset  . Hypertension Father   . Colon cancer Neg Hx   . Colon polyps Neg Hx     Social History   Socioeconomic History  . Marital status: Divorced    Spouse name: Not on file  . Number of children: Not on file  . Years of education: Not on file  . Highest education level: Not on file  Occupational History  . Not on file  Social Needs  . Financial resource strain: Not on file  . Food insecurity:    Worry: Not on file    Inability: Not on file  . Transportation needs:    Medical: Not on file    Non-medical: Not on file  Tobacco Use  . Smoking status: Never Smoker  .  Smokeless tobacco: Never Used  Substance and Sexual Activity  . Alcohol use: Yes    Comment: 4-5 shots of tequila for past 6+ months, occasional beer   . Drug use: No  . Sexual activity: Yes  Lifestyle  . Physical activity:    Days per week: Not on file    Minutes per session: Not on file  . Stress: Not on file  Relationships  . Social connections:    Talks on phone: Not on file    Gets together: Not on file    Attends religious service: Not on file    Active member of club or organization: Not on file    Attends meetings of clubs or organizations: Not on file    Relationship status: Not on file  . Intimate partner violence:    Fear of current or ex partner: Not on file    Emotionally abused: Not on file    Physically abused: Not on file    Forced sexual activity: Not on file  Other Topics Concern  . Not on file  Social History Narrative  . Not on file    Review of Systems: Gen: see HPI  CV: Denies chest pain, heart palpitations, syncope, edema  Resp: Denies shortness of breath with rest, cough, wheezing GI: see HPI GU : Denies urinary burning, urinary frequency, urinary incontinence.  MS: Denies joint pain,swelling, cramping Derm: Denies rash, itching, dry skin Psych: Denies depression, anxiety,confusion, or memory loss Heme: see HPI  Physical Exam: Vital signs in last 24 hours: Temp:  [97.8 F (36.6 C)-98.5 F (36.9 C)] 98.5 F (36.9 C) (03/24 0437) Pulse Rate:  [80-124] 81 (03/24 0700) Resp:  [13-27] 18 (03/24 0700) BP: (97-157)/(52-135) 108/70 (03/24 0700) SpO2:  [96 %-100 %] 97 % (03/24 0700) Weight:  [90.7 kg-97.5 kg] 97.5 kg (03/24 0441) Last BM Date: 05/12/18 General:   Alert,  Well-developed, well-nourished, pleasant and cooperative in NAD Head:  Normocephalic and atraumatic. Eyes:  Sclera clear, no icterus.   Conjunctiva pink. Ears:  Normal auditory acuity. Nose:  No deformity, discharge,  or lesions. Mouth:  No deformity or lesions Lungs:  Clear  throughout to auscultation.    Heart:  S1 S2 present; no murmurs, clicks, rubs,  or gallops. Abdomen:  Soft, nontender and nondistended. No masses, hepatosplenomegaly or hernias noted. Normal bowel sounds, without guarding, and without rebound.   Rectal:  Deferred  Msk:  Symmetrical without gross deformities. Normal posture. Extremities:  Without  edema. Neurologic:  Alert and  oriented x4 Psych:  Alert and cooperative. Normal mood and affect.  Intake/Output from previous day: 03/23 0701 - 03/24 0700 In: 1141.7 [I.V.:3.2; IV  Piggyback:1138.5] Out: 1000 [Urine:1000] Intake/Output this shift: No intake/output data recorded.  Lab Results: Recent Labs    05/11/18 2020 05/12/18 0508  WBC 14.4* 9.7  HGB 12.2* 9.9*  HCT 35.7* 28.8*  PLT 258 173   BMET Recent Labs    05/11/18 2020 05/12/18 0508  NA 137 138  K 3.9 3.9  CL 108 111  CO2 20* 21*  GLUCOSE 179* 104*  BUN 27* 32*  CREATININE 0.86 0.64  CALCIUM 8.2* 7.6*   LFT Recent Labs    05/11/18 2020 05/12/18 0508  PROT 5.9* 5.0*  ALBUMIN 3.3* 2.9*  AST 22 19  ALT 28 23  ALKPHOS 50 37*  BILITOT 0.7 0.4   PT/INR Recent Labs    05/11/18 2020  LABPROT 14.0  INR 1.1    Studies/Results: Ct Head Wo Contrast  Result Date: 05/11/2018 CLINICAL DATA:  Head trauma EXAM: CT HEAD WITHOUT CONTRAST TECHNIQUE: Contiguous axial images were obtained from the base of the skull through the vertex without intravenous contrast. COMPARISON:  None. FINDINGS: Brain: There is no mass, hemorrhage or extra-axial collection. The size and configuration of the ventricles and extra-axial CSF spaces are normal. The brain parenchyma is normal, without acute or chronic infarction. Vascular: No abnormal hyperdensity of the major intracranial arteries or dural venous sinuses. No intracranial atherosclerosis. Skull: The visualized skull base, calvarium and extracranial soft tissues are normal. Sinuses/Orbits: No fluid levels or advanced mucosal  thickening of the visualized paranasal sinuses. No mastoid or middle ear effusion. The orbits are normal. IMPRESSION: Normal head CT. Electronically Signed   By: Kevin  Herman M.D.   On: 05/11/2018 23:10    Impression: 62-year-old male presenting with new onset melena starting yesterday, associated weakness, fatigue, syncopal episodes in setting of acute blood loss anemia due to UGI bleed. Melena continued overnight with last episode 10 minutes ago. Almost 3 gram drop in Hgb since admission but hemodynamically stable. Endorses NSAID use several months ago but none in the past month; he has also increased alcohol intake since brother's passing June 2019 and takes 4-5 shots of tequila daily. No known liver disease.   Remote history of GI bleed in 2007 requiring hospitalization with EGD performed at that time. Unable to retreive this from epic, but a CT scan is on file from that hospitalization with diffuse distal esophageal wall thickening extending to GE junction.   Patient has been NPO except ice chips and instructed to avoid any further intake of ice chips for now. Discussed risks and benefits of EGD with stated understanding and agreeable to procedure.   Plan: NPO except sips with meds Continue PPI infusion Follow H/H EGD by Dr. Fields with Propofol to be arranged Discussed avoidance of NSAIDs and ETOH cessation going forward Elective outpatient colonoscopy for routine screening purposes   Anna W. Boone, PhD, ANP-BC [facility]     LOS: 1 day    05/12/2018, 8:53 AM    

## 2018-05-12 NOTE — Anesthesia Preprocedure Evaluation (Signed)
Anesthesia Evaluation    Airway Mallampati: II       Dental  (+) Teeth Intact   Pulmonary    breath sounds clear to auscultation       Cardiovascular hypertension, On Medications  Rhythm:regular     Neuro/Psych    GI/Hepatic GERD  ,  Endo/Other    Renal/GU      Musculoskeletal   Abdominal   Peds  Hematology  (+) Blood dyscrasia, anemia ,   Anesthesia Other Findings Reported h/o ETOH abuse, increased recently "syncopal episodes" Upper GI Bleed. Hgb today 9.9  Reproductive/Obstetrics                             Anesthesia Physical Anesthesia Plan  ASA: III  Anesthesia Plan: MAC   Post-op Pain Management:    Induction:   PONV Risk Score and Plan:   Airway Management Planned:   Additional Equipment:   Intra-op Plan:   Post-operative Plan:   Informed Consent: I have reviewed the patients History and Physical, chart, labs and discussed the procedure including the risks, benefits and alternatives for the proposed anesthesia with the patient or authorized representative who has indicated his/her understanding and acceptance.     Dental Advisory Given  Plan Discussed with: Anesthesiologist  Anesthesia Plan Comments:         Anesthesia Quick Evaluation

## 2018-05-12 NOTE — Op Note (Signed)
[facility] Patient Name: Marco Stevenson Procedure Date: 05/12/2018 1:31 PM MRN: [id number] Date of Birth: 1955-07-31 Attending MD: Jonette Eva MD, MD CSN: [id number] Age: 63 Admit Type: Inpatient Procedure:                Upper GI endoscopy WITH EPI INJECTION/SNARE CAUTERY                            POLYPECTOMY Indications:              Melena AND DAILY ETOH USE Providers:                Jonette Eva MD, MD, Edrick Kins, RN, Burke Keels, Technician Referring MD:             Gareth Morgan Medicines:                Propofol per Anesthesia Complications:            No immediate complications. Estimated Blood Loss:     Estimated blood loss: none. Procedure:                Pre-Anesthesia Assessment:                           - Prior to the procedure, a History and Physical                            was performed, and patient medications and                            allergies were reviewed. The patient's tolerance of                            previous anesthesia was also reviewed. The risks                            and benefits of the procedure and the sedation                            options and risks were discussed with the patient.                            All questions were answered, and informed consent                            was obtained. Prior Anticoagulants: The patient has                            taken no previous anticoagulant or antiplatelet                            agents. ASA Grade Assessment: II - A patient with  mild systemic disease. After reviewing the risks                            and benefits, the patient was deemed in                            satisfactory condition to undergo the procedure.                           After obtaining informed consent, the endoscope was                            passed under direct vision. Throughout the                            procedure, the  patient's blood pressure, pulse, and                            oxygen saturations were monitored continuously. The                            GIF-H190 ([id number]) scope was introduced through the                            mouth, and advanced to the second part of duodenum.                            The upper GI endoscopy was somewhat difficult due                            to poor endoscopic visualization. The patient                            tolerated the procedure well. Scope In: 1:54:02 PM Scope Out: 2:06:55 PM Total Procedure Duration: 0 hours 12 minutes 53 seconds  Findings:      The examined esophagus was normal. NO VARICES.      LARGE AMOUNT OF OLD BLOOD IN THE STOMACH. NO FRESH BLOOD OR CLOTS.       LIMITED VISUALIZATION.      Multiple 10-15 mm semi-sessile polypoid llesions with central ulceration       with no bleeding and stigmata of recent bleeding were found on the       greater curvature of the stomach. ONE polyp was removed with a hot       snare. Resection and retrieval were complete. Area was successfully       injected with 1 mL of a 1:10,000 solution of epinephrine for drug       delivery PRIOR TO POLYPECTOMY.      A medium-sized polypoid mass with no bleeding was found in the area of       the papilla AND OCCUPIES ~1/3 OF THE LUMEN. Impression:               - MELENA DUE TO MULTIPLE SUBMUCOSAL GASTRIC  LESIONS. DIFFERENTIAL DIAGNOSIS INCLUDES:                            NEUROENDOCRINE TUMOR OR GIST.                           - ONE gastric polyps. Resected and retrieved.                           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 Moderate Sedation:      Per Anesthesia Care Recommendation:           - Return patient to hospital ward for ongoing care.                           - Full liquid diet.                           - Continue present medications.                            - Await pathology results.                           - DISCUSSED WITH DRs. SCHOOLER AND JOHNSON. WILL                            TRANSFER TO [place] FOR EUS.                           - Return to my office in 4 months. Procedure Code(s):        --- Professional ---                           [phone], Esophagogastroduodenoscopy, flexible,                            transoral; with removal of tumor(s), polyp(s), or                            other lesion(s) by snare technique                           43236, Esophagogastroduodenoscopy, flexible,                            transoral; with directed submucosal injection(s),                            any substance Diagnosis Code(s):        --- Professional ---                           K25.9, Gastric ulcer, unspecified as acute or  chronic, without hemorrhage or perforation                           K31.7, Polyp of stomach and duodenum                           K92.1, Melena (includes Hematochezia) CPT copyright 2018 American Medical Association. All rights reserved. The codes documented in this report are preliminary and upon coder review may  be revised to meet current compliance requirements. Jonette EvaSandi Fields, MD Jonette EvaSandi Fields MD, MD 05/12/2018 2:35:15 PM This report has been signed electronically. Number of Addenda: 0

## 2018-05-13 ENCOUNTER — Encounter (HOSPITAL_COMMUNITY): Payer: Self-pay | Admitting: Gastroenterology

## 2018-05-13 ENCOUNTER — Inpatient Hospital Stay (HOSPITAL_COMMUNITY): Payer: Commercial Managed Care - PPO

## 2018-05-13 LAB — BASIC METABOLIC PANEL WITH GFR
Anion gap: 7 (ref 5–15)
BUN: 11 mg/dL (ref 6–20)
CO2: 22 mmol/L (ref 22–32)
Calcium: 8 mg/dL — ABNORMAL LOW (ref 8.9–10.3)
Chloride: 109 mmol/L (ref 98–111)
Creatinine, Ser: 0.97 mg/dL (ref 0.61–1.24)
GFR calc Af Amer: >60 mL/min
GFR calc non Af Amer: >60 mL/min
Glucose, Bld: 107 mg/dL — ABNORMAL HIGH (ref 70–99)
Potassium: 4 mmol/L (ref 3.5–5.1)
Sodium: 138 mmol/L (ref 135–145)

## 2018-05-13 LAB — TYPE AND SCREEN
ABO/RH(D): B POS
Antibody Screen: NEGATIVE

## 2018-05-13 LAB — CBC WITH DIFFERENTIAL/PLATELET
Abs Immature Granulocytes: 0.03 10*3/uL (ref 0.00–0.07)
Basophils Absolute: 0.1 10*3/uL (ref 0.0–0.1)
Basophils Relative: 1 %
Eosinophils Absolute: 0.1 10*3/uL (ref 0.0–0.5)
Eosinophils Relative: 2 %
HCT: 27.8 % — ABNORMAL LOW (ref 39.0–52.0)
Hemoglobin: 9.5 g/dL — ABNORMAL LOW (ref 13.0–17.0)
Immature Granulocytes: 0 %
Lymphocytes Relative: 38 %
Lymphs Abs: 3 10*3/uL (ref 0.7–4.0)
MCH: 31.8 pg (ref 26.0–34.0)
MCHC: 34.2 g/dL (ref 30.0–36.0)
MCV: 93 fL (ref 80.0–100.0)
Monocytes Absolute: 0.7 10*3/uL (ref 0.1–1.0)
Monocytes Relative: 10 %
Neutro Abs: 3.9 10*3/uL (ref 1.7–7.7)
Neutrophils Relative %: 49 %
Platelets: 161 10*3/uL (ref 150–400)
RBC: 2.99 MIL/uL — ABNORMAL LOW (ref 4.22–5.81)
RDW: 12.8 % (ref 11.5–15.5)
WBC: 7.8 10*3/uL (ref 4.0–10.5)
nRBC: 0 % (ref 0.0–0.2)

## 2018-05-13 LAB — HIV ANTIBODY (ROUTINE TESTING W REFLEX): HIV Screen 4th Generation wRfx: NONREACTIVE

## 2018-05-13 LAB — ABO/RH: ABO/RH(D): B POS

## 2018-05-13 MED ORDER — IOHEXOL 300 MG/ML  SOLN
100.0000 mL | Freq: Once | INTRAMUSCULAR | Status: AC | PRN
  Administered 2018-05-13: 100 mL via INTRAVENOUS

## 2018-05-13 MED ORDER — ADULT MULTIVITAMIN W/MINERALS CH
1.0000 | ORAL_TABLET | Freq: Every day | ORAL | Status: DC
  Administered 2018-05-14 – 2018-05-15 (×2): 1 via ORAL
  Filled 2018-05-13 (×2): qty 1

## 2018-05-13 MED ORDER — FOLIC ACID 1 MG PO TABS
1.0000 mg | ORAL_TABLET | Freq: Every day | ORAL | Status: DC
  Administered 2018-05-14 – 2018-05-15 (×2): 1 mg via ORAL
  Filled 2018-05-13 (×2): qty 1

## 2018-05-13 MED ORDER — WITCH HAZEL-GLYCERIN EX PADS
MEDICATED_PAD | CUTANEOUS | Status: DC | PRN
  Filled 2018-05-13: qty 100

## 2018-05-13 MED ORDER — VITAMIN B-1 100 MG PO TABS
100.0000 mg | ORAL_TABLET | Freq: Every day | ORAL | Status: DC
  Administered 2018-05-14 – 2018-05-15 (×2): 100 mg via ORAL
  Filled 2018-05-13 (×2): qty 1

## 2018-05-13 NOTE — Consult Note (Signed)
Referring Provider: Dr. Hanley Ben Primary Care Physician:  Gareth Morgan, MD Primary Gastroenterologist:  Gentry Fitz  Reason for Consultation:  Melena  HPI: Marco DELOE is a 63 y.o. male with acute onset of melenic loose stools and dizziness on 05/11/18 at [facility] without abdominal pain, nausea, vomiting, hematochezia, hematemesis, or weight loss. EGD on 05/12/18 by Dr. Jonette Eva showed multiple ulcerated polypoid lesions in the stomach with bleeding stigmata and black fluid coating scattered areas of the stomach. One of the gastric polyps was injected with epinephrine:saline mixture and then removed with a hot snare. 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 Hgb 12.2 on admit at [facility] and dropped to 9.9 yesterday prior to transfer. Hgb 9.5 here. He reports multiple ("12") black loose stools overnight. Denies previous episodes of GI bleeding. Reports a normal colonoscopy 10 years ago.  Past Medical History:  Diagnosis Date  . Acid reflux   . Hypertension     Past Surgical History:  Procedure Laterality Date  . ESOPHAGOGASTRODUODENOSCOPY (EGD) WITH PROPOFOL N/A 05/12/2018   Procedure: ESOPHAGOGASTRODUODENOSCOPY (EGD) WITH PROPOFOL;  Surgeon: West Bali, MD;  Location: [facility];  Service: Endoscopy;  Laterality: N/A;  . HERNIA REPAIR    . POLYPECTOMY  05/12/2018   Procedure: POLYPECTOMY;  Surgeon: West Bali, MD;  Location: [facility]O SUITE;  Service: Endoscopy;;  polyp gastric    Prior to Admission medications   Medication Sig Start Date End Date Taking? Authorizing Provider  ALPRAZolam Prudy Feeler) 1 MG tablet Take 0.5-1 mg by mouth 3 (three) times a week. At bedtime depending on work schedule 03/13/18  Yes [provider]  losartan (COZAAR) 100 MG tablet Take 100 mg by mouth daily.  04/08/18  Yes [provider]  pantoprazole (PROTONIX) 40 MG tablet Take 40 mg by mouth daily.   Yes [provider]   sildenafil (VIAGRA) 25 MG tablet Take 25 mg by mouth as needed for erectile dysfunction.    Yes [provider]    Scheduled Meds: . Chlorhexidine Gluconate Cloth  6 each Topical Q0600  . [START ON 05/14/2018] folic acid  1 mg Oral Daily  . [START ON 05/14/2018] multivitamin with minerals  1 tablet Oral Daily  . sodium chloride flush  3 mL Intravenous Q12H  . [START ON 05/14/2018] thiamine  100 mg Oral Daily   Continuous Infusions: . sodium chloride 100 mL/hr at 05/13/18 0651  . pantoprozole (PROTONIX) infusion 8 mg/hr (05/13/18 0943)   PRN Meds:.LORazepam, ondansetron **OR** ondansetron (ZOFRAN) IV  Allergies as of 05/11/2018  . (No Known Allergies)    Family History  Problem Relation Age of Onset  . Hypertension Father   . Colon cancer Neg Hx   . Colon polyps Neg Hx     Social History   Socioeconomic History  . Marital status: Divorced    Spouse name: Not on file  . Number of children: Not on file  . Years of education: Not on file  . Highest education level: Not on file  Occupational History  . Not on file  Social Needs  . Financial resource strain: Not on file  . Food insecurity:    Worry: Not on file    Inability: Not on file  . Transportation needs:    Medical: Not on file    Non-medical: Not on file  Tobacco Use  . Smoking status: Never Smoker  . Smokeless tobacco: Never Used  Substance and  Sexual Activity  . Alcohol use: Yes    Comment: 4-5 shots of tequila for past 6+ months, occasional beer   . Drug use: No  . Sexual activity: Yes  Lifestyle  . Physical activity:    Days per week: Not on file    Minutes per session: Not on file  . Stress: Not on file  Relationships  . Social connections:    Talks on phone: Not on file    Gets together: Not on file    Attends religious service: Not on file    Active member of club or organization: Not on file    Attends meetings of clubs or organizations: Not on file    Relationship status: Not on file   . Intimate partner violence:    Fear of current or ex partner: Not on file    Emotionally abused: Not on file    Physically abused: Not on file    Forced sexual activity: Not on file  Other Topics Concern  . Not on file  Social History Narrative  . Not on file    Review of Systems: All negative except as stated above in HPI.  Physical Exam: Vital signs: Vitals:   05/12/18 1736 05/12/18 2246  BP: (!) 135/92 121/82  Pulse: 85 79  Resp:  16  Temp: 98.6 F (37 C) 99.8 F (37.7 C)  SpO2: 100% 100%   Last BM Date: 05/13/18 General:   Alert,  Well-developed, well-nourished, pleasant and cooperative in NAD Head: normocephalic, atraumatic Eyes: anicteric sclera ENT: oropharynx clear Neck: supple, nontender Lungs:  Clear throughout to auscultation.   No wheezes, crackles, or rhonchi. No acute distress. Heart:  Regular rate and rhythm; no murmurs, clicks, rubs,  or gallops. Abdomen: soft, nontender, nondistended, +BS  Rectal:  Deferred Ext: no edema  GI:  Lab Results: Recent Labs    05/11/18 2020 05/12/18 0508 05/12/18 1952 05/13/18 0758  WBC 14.4* 9.7  --  7.8  HGB 12.2* 9.9* 9.4* 9.5*  HCT 35.7* 28.8* 28.5* 27.8*  PLT 258 173  --  161   BMET Recent Labs    05/11/18 2020 05/12/18 0508 05/13/18 0758  NA 137 138 138  K 3.9 3.9 4.0  CL 108 111 109  CO2 20* 21* 22  GLUCOSE 179* 104* 107*  BUN 27* 32* 11  CREATININE 0.86 0.64 0.97  CALCIUM 8.2* 7.6* 8.0*   LFT Recent Labs    05/12/18 0508  PROT 5.0*  ALBUMIN 2.9*  AST 19  ALT 23  ALKPHOS 37*  BILITOT 0.4   PT/INR Recent Labs    05/11/18 2020  LABPROT 14.0  INR 1.1     Studies/Results: Ct Head Wo Contrast  Result Date: 05/11/2018 CLINICAL DATA:  Head trauma EXAM: CT HEAD WITHOUT CONTRAST TECHNIQUE: Contiguous axial images were obtained from the base of the skull through the vertex without intravenous contrast. COMPARISON:  None. FINDINGS: Brain: There is no mass, hemorrhage or extra-axial  collection. The size and configuration of the ventricles and extra-axial CSF spaces are normal. The brain parenchyma is normal, without acute or chronic infarction. Vascular: No abnormal hyperdensity of the major intracranial arteries or dural venous sinuses. No intracranial atherosclerosis. Skull: The visualized skull base, calvarium and extracranial soft tissues are normal. Sinuses/Orbits: No fluid levels or advanced mucosal thickening of the visualized paranasal sinuses. No mastoid or middle ear effusion. The orbits are normal. IMPRESSION: Normal head CT. Electronically Signed   By: Deatra Robinson M.D.   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 Darrick Penna. Polyps likely benign but ampullary mass appearance concerning for malignancy. I do NOT think EUS is the next best step for him and will do an abd CT with contrast to further evaluate. Likely will need a repeat EGD tomorrow using a side-viewing scope to better evaluate the ampullary mass and obtain a tissue sample. D/W Dr. Dulce Sellar who recommended this plan as well. If biopsies are not conclusive, then will consider an EUS at that time. Patient requesting a repeat colonoscopy but since this would be a screening colonoscopy since the bleeding source appears to be an upper tract source this requested colonoscopy would be considered an elective procedure and therefore would not be able to do due to the COVID-19 pandemic. After CT has been done will start clear liquid diet and NPO p MN for repeat EGD with side viewing scope.    LOS: 2 days   Shirley Friar  05/13/2018, 10:31 AM  Questions please call [phone]

## 2018-05-13 NOTE — Progress Notes (Signed)
Marco Stevenson was concerned that he had numerous bloody stools ad Hgb might have dropped below 7. Order for H/H placed  with   Hgb reading of 9.4

## 2018-05-13 NOTE — Progress Notes (Signed)
Patient ID: Marco Stevenson, male   DOB: 29-Sep-1955, 63 y.o.   MRN: [id number]  PROGRESS NOTE    Marco Stevenson  NWG:[id number] DOB: 1955/04/19 DOA: 05/11/2018 PCP: Gareth Morgan, MD   Brief Narrative:  63 year old male with history of GERD, peptic ulcer disease, previous GI bleed, hypertension presented on 05/10/2020 to [facility] with melanotic stools left bloody diarrhea and 2 syncopal episodes.  GI was consulted.  He was started on IV Protonix.  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  He was transferred to [facility] for possibility of EUS.  Assessment & Plan:   Principal Problem:   UGI bleed Active Problems:   Hypertension   ETOH abuse   Acid reflux   Hypomagnesemia   Acute blood loss anemia   Syncopal episodes  Upper GI bleeding in a patient with history of recurrent upper GI bleeding -Status post EGD on 05/12/2018 which showed multiple submucosal gastric lesions and ampullary mass. He was transferred to [facility] for possibility of EUS. -Notified Dr. Bosie Clos this morning and has kept patient n.p.o. -Still having melanotic stools.  Check stat labs.  Continue Protonix drip.  Syncopal episodes -Most likely secondary to vasovagal from GI bleeding along with dehydration. -No further events  Alcohol abuse--continue CIWA protocol.  Continue multivitamin, thiamine and folic acid.  Essential hypertension -Monitor blood pressure.  Losartan on hold   DVT prophylaxis: SCDs Code Status: Full Family Communication: None at bedside Disposition Plan: Depends on clinical outcome  Consultants: GI  Procedures:  EGD on 05/12/2018 Findings:      The examined esophagus was normal. NO VARICES.      LARGE AMOUNT OF OLD BLOOD IN THE STOMACH. NO FRESH BLOOD OR CLOTS.       LIMITED VISUALIZATION.      Multiple 10-15 mm semi-sessile polypoid llesions with central ulceration       with no bleeding and stigmata of  recent bleeding were found on the       greater curvature of the stomach. ONE polyp was removed with a hot       snare. Resection and retrieval were complete. Area was successfully       injected with 1 mL of a 1:10,000 solution of epinephrine for drug       delivery PRIOR TO POLYPECTOMY.      A medium-sized polypoid mass with no bleeding was found in the area of       the papilla AND OCCUPIES ~1/3 OF THE LUMEN. Impression:               - MELENA DUE TO MULTIPLE SUBMUCOSAL GASTRIC                            LESIONS. DIFFERENTIAL DIAGNOSIS INCLUDES:                            NEUROENDOCRINE TUMOR OR GIST.                           - ONE gastric polyps. Resected and retrieved.                           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 Moderate Sedation:      Per Anesthesia Care Recommendation:           - Return patient to hospital ward for ongoing care.                           - Full liquid diet.                           - Continue present medications.                           - Await pathology results.                           - DISCUSSED WITH DRs. SCHOOLER AND JOHNSON. WILL                            TRANSFER TO [facility] FOR EUS.                           - Return to my office in 4 months.   Antimicrobials:  None  Subjective: Patient seen and examined at bedside.  Still having black bowel movements overnight.  No overnight fever or vomiting.  No abdominal pain.  Objective: Vitals:   05/12/18 1415 05/12/18 1736 05/12/18 2246 05/13/18 0445  BP: 123/78 (!) 135/92 121/82   Pulse: 94 85 79   Resp: 16  16   Temp: 98.4 F (36.9 C) 98.6 F (37 C) 99.8 F (37.7 C)   TempSrc:  Oral Oral   SpO2: 97% 100% 100%   Weight:  96.9 kg  99.3 kg  Height:  5\' 10"  (1.778 m)      Intake/Output Summary (Last 24 hours) at 05/13/2018 0944 Last data filed at 05/13/2018 0651 Gross per 24 hour  Intake 849.13 ml  Output -  Net 849.13  ml   Filed Weights   05/12/18 0441 05/12/18 1736 05/13/18 0445  Weight: 97.5 kg 96.9 kg 99.3 kg    Examination:  General exam: Appears calm and comfortable  Respiratory system: Bilateral decreased breath sounds at bases Cardiovascular system: S1 & S2 heard, Rate controlled Gastrointestinal system: Abdomen is nondistended, soft and nontender. Normal bowel sounds heard. Extremities: No cyanosis, clubbing, edema    Data Reviewed: I have personally reviewed following labs and imaging studies  CBC: Recent Labs  Lab 05/11/18 2020 05/12/18 0508 05/12/18 1952 05/13/18 0758  WBC 14.4* 9.7  --  7.8  NEUTROABS 10.5*  --   --  3.9  HGB 12.2* 9.9* 9.4* 9.5*  HCT 35.7* 28.8* 28.5* 27.8*  MCV 91.8 92.3  --  93.0  PLT 258 173  --  161   Basic Metabolic Panel: Recent Labs  Lab 05/11/18 2020 05/12/18 0508 05/13/18 0758  NA 137 138 138  K 3.9 3.9 4.0  CL 108 111 109  CO2 20* 21* 22  GLUCOSE 179* 104* 107*  BUN 27* 32* 11  CREATININE 0.86 0.64 0.97  CALCIUM 8.2* 7.6* 8.0*  MG 1.6*  --   --   PHOS 3.2  --   --    GFR: Estimated Creatinine Clearance: 95.6 mL/min (by C-G formula based on SCr of 0.97 mg/dL). Liver Function Tests: Recent Labs  Lab 05/11/18 2020 05/12/18  0508  AST 22 19  ALT 28 23  ALKPHOS 50 37*  BILITOT 0.7 0.4  PROT 5.9* 5.0*  ALBUMIN 3.3* 2.9*   Recent Labs  Lab 05/11/18 2020  LIPASE 33   No results for input(s): AMMONIA in the last 168 hours. Coagulation Profile: Recent Labs  Lab 05/11/18 2020  INR 1.1   Cardiac Enzymes: Recent Labs  Lab 05/11/18 2020 05/12/18 0508  TROPONINI <0.03 <0.03   BNP (last 3 results) No results for input(s): PROBNP in the last 8760 hours. HbA1C: No results for input(s): HGBA1C in the last 72 hours. CBG: Recent Labs  Lab 05/11/18 2029 05/12/18 0708  GLUCAP 166* 88   Lipid Profile: No results for input(s): CHOL, HDL, LDLCALC, TRIG, CHOLHDL, LDLDIRECT in the last 72 hours. Thyroid Function Tests: No  results for input(s): TSH, T4TOTAL, FREET4, T3FREE, THYROIDAB in the last 72 hours. Anemia Panel: No results for input(s): VITAMINB12, FOLATE, FERRITIN, TIBC, IRON, RETICCTPCT in the last 72 hours. Sepsis Labs: No results for input(s): PROCALCITON, LATICACIDVEN in the last 168 hours.  Recent Results (from the past 240 hour(s))  MRSA PCR Screening     Status: None   Collection Time: 05/11/18 11:07 PM  Result Value Ref Range Status   MRSA by PCR NEGATIVE NEGATIVE Final    Comment:        The GeneXpert MRSA Assay (FDA approved for NASAL specimens only), is one component of a comprehensive MRSA colonization surveillance program. It is not intended to diagnose MRSA infection nor to guide or monitor treatment for MRSA infections. Performed at [facility], [address], [place], [place] [address]          Radiology Studies: Ct Head Wo Contrast  Result Date: 05/11/2018 CLINICAL DATA:  Head trauma EXAM: CT HEAD WITHOUT CONTRAST TECHNIQUE: Contiguous axial images were obtained from the base of the skull through the vertex without intravenous contrast. COMPARISON:  None. FINDINGS: Brain: There is no mass, hemorrhage or extra-axial collection. The size and configuration of the ventricles and extra-axial CSF spaces are normal. The brain parenchyma is normal, without acute or chronic infarction. Vascular: No abnormal hyperdensity of the major intracranial arteries or dural venous sinuses. No intracranial atherosclerosis. Skull: The visualized skull base, calvarium and extracranial soft tissues are normal. Sinuses/Orbits: No fluid levels or advanced mucosal thickening of the visualized paranasal sinuses. No mastoid or middle ear effusion. The orbits are normal. IMPRESSION: Normal head CT. Electronically Signed   By: Deatra Robinson M.D.   On: 05/11/2018 23:10        Scheduled Meds: . Chlorhexidine Gluconate Cloth  6 each Topical Q0600  . sodium chloride flush  3 mL Intravenous Q12H    Continuous Infusions: . sodium chloride 100 mL/hr at 05/13/18 0651  . pantoprozole (PROTONIX) infusion 8 mg/hr (05/13/18 0651)     LOS: 2 days        Glade Lloyd, MD [facility] 05/13/2018, 9:44 AM

## 2018-05-14 ENCOUNTER — Encounter (HOSPITAL_COMMUNITY)
Admission: EM | Disposition: A | Discharge status: Home / Self Care | Payer: Self-pay | Source: Home / Self Care | Attending: Internal Medicine

## 2018-05-14 ENCOUNTER — Encounter (HOSPITAL_COMMUNITY): Payer: Self-pay | Admitting: Registered Nurse

## 2018-05-14 ENCOUNTER — Inpatient Hospital Stay (HOSPITAL_COMMUNITY): Payer: Commercial Managed Care - PPO | Admitting: Anesthesiology

## 2018-05-14 HISTORY — PX: ESOPHAGOGASTRODUODENOSCOPY (EGD) WITH PROPOFOL: SHX5813

## 2018-05-14 HISTORY — PX: BIOPSY: SHX5522

## 2018-05-14 LAB — CBC WITH DIFFERENTIAL/PLATELET
Abs Immature Granulocytes: 0.03 10*3/uL (ref 0.00–0.07)
Basophils Absolute: 0.1 10*3/uL (ref 0.0–0.1)
Basophils Relative: 1 %
Eosinophils Absolute: 0.1 10*3/uL (ref 0.0–0.5)
Eosinophils Relative: 2 %
HCT: 25.7 % — ABNORMAL LOW (ref 39.0–52.0)
Hemoglobin: 8.4 g/dL — ABNORMAL LOW (ref 13.0–17.0)
Immature Granulocytes: 1 %
Lymphocytes Relative: 36 %
Lymphs Abs: 2.1 10*3/uL (ref 0.7–4.0)
MCH: 30.2 pg (ref 26.0–34.0)
MCHC: 32.7 g/dL (ref 30.0–36.0)
MCV: 92.4 fL (ref 80.0–100.0)
Monocytes Absolute: 0.7 10*3/uL (ref 0.1–1.0)
Monocytes Relative: 12 %
Neutro Abs: 2.8 10*3/uL (ref 1.7–7.7)
Neutrophils Relative %: 48 %
Platelets: 126 10*3/uL — ABNORMAL LOW (ref 150–400)
RBC: 2.78 MIL/uL — ABNORMAL LOW (ref 4.22–5.81)
RDW: 12.6 % (ref 11.5–15.5)
WBC: 5.9 10*3/uL (ref 4.0–10.5)
nRBC: 0 % (ref 0.0–0.2)

## 2018-05-14 LAB — MAGNESIUM: Magnesium: 2.1 mg/dL (ref 1.7–2.4)

## 2018-05-14 LAB — BASIC METABOLIC PANEL
Anion gap: 7 (ref 5–15)
BUN: <5 mg/dL — ABNORMAL LOW (ref 6–20)
CO2: 23 mmol/L (ref 22–32)
Calcium: 8 mg/dL — ABNORMAL LOW (ref 8.9–10.3)
Chloride: 109 mmol/L (ref 98–111)
Creatinine, Ser: 0.94 mg/dL (ref 0.61–1.24)
GFR calc Af Amer: >60 mL/min (ref >60)
GFR calc non Af Amer: >60 mL/min (ref >60)
Glucose, Bld: 91 mg/dL (ref 70–99)
Potassium: 3.4 mmol/L — ABNORMAL LOW (ref 3.5–5.1)
Sodium: 139 mmol/L (ref 135–145)

## 2018-05-14 SURGERY — ESOPHAGOGASTRODUODENOSCOPY (EGD) WITH PROPOFOL
Anesthesia: Monitor Anesthesia Care

## 2018-05-14 MED ORDER — PROPOFOL 500 MG/50ML IV EMUL
INTRAVENOUS | Status: DC | PRN
  Administered 2018-05-14: 120 ug/kg/min via INTRAVENOUS

## 2018-05-14 MED ORDER — PROPOFOL 10 MG/ML IV BOLUS
INTRAVENOUS | Status: DC | PRN
  Administered 2018-05-14: 20 mg via INTRAVENOUS
  Administered 2018-05-14: 40 mg via INTRAVENOUS

## 2018-05-14 MED ORDER — LACTATED RINGERS IV SOLN: INTRAVENOUS | Status: DC

## 2018-05-14 MED ORDER — LACTATED RINGERS IV SOLN
INTRAVENOUS | Status: DC | PRN
  Administered 2018-05-14 (×2): via INTRAVENOUS

## 2018-05-14 MED ORDER — PANTOPRAZOLE SODIUM 40 MG PO TBEC
40.0000 mg | DELAYED_RELEASE_TABLET | Freq: Every day | ORAL | Status: DC
  Administered 2018-05-14 – 2018-05-15 (×2): 40 mg via ORAL
  Filled 2018-05-14 (×2): qty 1

## 2018-05-14 MED ORDER — LIDOCAINE 2% (20 MG/ML) 5 ML SYRINGE
INTRAMUSCULAR | Status: DC | PRN
  Administered 2018-05-14: 80 mg via INTRAVENOUS

## 2018-05-14 MED ORDER — BUTAMBEN-TETRACAINE-BENZOCAINE 2-2-14 % EX AERO
INHALATION_SPRAY | CUTANEOUS | Status: DC | PRN
  Administered 2018-05-14: 1 via TOPICAL

## 2018-05-14 MED ORDER — PHENYLEPHRINE HCL 10 MG/ML IJ SOLN
INTRAMUSCULAR | Status: DC | PRN
  Administered 2018-05-14 (×2): 80 ug via INTRAVENOUS

## 2018-05-14 SURGICAL SUPPLY — 14 items

## 2018-05-14 NOTE — Progress Notes (Signed)
Ericka Pontiff 8:42 AM  Subjective: Patient doing well without any new problems no problems from his recent endoscopy and his hospital computer chart was reviewed and his case discussed with my partner Dr. Bosie Clos and he says he had been on lots of ibuprofen recently but had bleeding only without any other GI symptoms and did have a colonoscopy at age 63 and is due for another screening but no immediate GI cancers run in the family nor any cancers in the women in the immediate family either  Objective: Vital signs stable afebrile exam please see preassessment evaluation slight decreased hemoglobin and BUN pathology of gastric polyp pending CT reviewed  Assessment: Ampullary polyp/mass  Plan: I did discuss the difference between the duodenal abnormality and his gastric polyp that was biopsied and he will probably need surgery and will proceed with repeat endoscopy probably with the side-viewing scope and probable biopsy with anesthesia assistance  [facility] E  Pager [phone] After 5PM or if no answer call [phone]

## 2018-05-14 NOTE — Anesthesia Preprocedure Evaluation (Addendum)
Anesthesia Evaluation  Patient identified by MRN, date of birth, ID band Patient awake    Reviewed: Allergy & Precautions, NPO status , Patient's Chart, lab work & pertinent test results  Airway Mallampati: II  TM Distance: >3 FB Neck ROM: Full    Dental  (+) Dental Advisory Given   Pulmonary neg pulmonary ROS,    breath sounds clear to auscultation       Cardiovascular hypertension, Pt. on medications  Rhythm:Regular Rate:Normal     Neuro/Psych negative neurological ROS     GI/Hepatic Neg liver ROS, GERD  ,  Endo/Other  negative endocrine ROS  Renal/GU negative Renal ROS     Musculoskeletal   Abdominal   Peds  Hematology  (+) anemia ,   Anesthesia Other Findings   Reproductive/Obstetrics                             Lab Results  Component Value Date   WBC 5.9 05/14/2018   HGB 8.4 (L) 05/14/2018   HCT 25.7 (L) 05/14/2018   MCV 92.4 05/14/2018   PLT 126 (L) 05/14/2018   Lab Results  Component Value Date   CREATININE 0.94 05/14/2018   BUN <5 (L) 05/14/2018   NA 139 05/14/2018   K 3.4 (L) 05/14/2018   CL 109 05/14/2018   CO2 23 05/14/2018    Anesthesia Physical Anesthesia Plan  ASA: III  Anesthesia Plan: MAC   Post-op Pain Management:    Induction: Intravenous  PONV Risk Score and Plan: 2 and Ondansetron, Treatment may vary due to age or medical condition and Propofol infusion  Airway Management Planned: Natural Airway and Nasal Cannula  Additional Equipment:   Intra-op Plan:   Post-operative Plan:   Informed Consent: I have reviewed the patients History and Physical, chart, labs and discussed the procedure including the risks, benefits and alternatives for the proposed anesthesia with the patient or authorized representative who has indicated his/her understanding and acceptance.       Plan Discussed with: CRNA  Anesthesia Plan Comments:         Anesthesia Quick Evaluation

## 2018-05-14 NOTE — Progress Notes (Signed)
LMOM to call.

## 2018-05-14 NOTE — Anesthesia Postprocedure Evaluation (Signed)
Anesthesia Post Note  Patient: Marco Stevenson  Procedure(s) Performed: ESOPHAGOGASTRODUODENOSCOPY (EGD) WITH PROPOFOL (N/A ) BIOPSY     Patient location during evaluation: PACU Anesthesia Type: MAC Level of consciousness: awake and alert Pain management: pain level controlled Vital Signs Assessment: post-procedure vital signs reviewed and stable Respiratory status: spontaneous breathing, nonlabored ventilation, respiratory function stable and patient connected to nasal cannula oxygen Cardiovascular status: stable and blood pressure returned to baseline Postop Assessment: no apparent nausea or vomiting Anesthetic complications: no    Last Vitals:  Vitals:   05/14/18 1044 05/14/18 1050  BP: 119/85   Pulse: 75 77  Resp: 20 20  Temp:    SpO2: 100% 100%    Last Pain:  Vitals:   05/14/18 1044  TempSrc:   PainSc: 0-No pain                 Tiajuana Amass

## 2018-05-14 NOTE — Progress Notes (Signed)
CC'D TO PCP °

## 2018-05-14 NOTE — Transfer of Care (Signed)
Immediate Anesthesia Transfer of Care Note  Patient: Marco Stevenson  Procedure(s) Performed: ESOPHAGOGASTRODUODENOSCOPY (EGD) WITH PROPOFOL (N/A ) BIOPSY  Patient Location: Endoscopy Unit  Anesthesia Type:MAC  Level of Consciousness: awake, alert  and oriented  Airway & Oxygen Therapy: Patient Spontanous Breathing and Patient connected to face mask oxygen  Post-op Assessment: Report given to RN and Post -op Vital signs reviewed and stable  Post vital signs: Reviewed and stable  Last Vitals:  Vitals Value Taken Time  BP 103/81 05/14/2018 10:34 AM  Temp 36.8 C 05/14/2018 10:34 AM  Pulse 74 05/14/2018 10:35 AM  Resp 18 05/14/2018 10:35 AM  SpO2 100 % 05/14/2018 10:35 AM  Vitals shown include unvalidated device data.  Last Pain:  Vitals:   05/14/18 1034  TempSrc: Oral  PainSc: 0-No pain      Patients Stated Pain Goal: 3 (29/56/21 3086)  Complications: No apparent anesthesia complications

## 2018-05-14 NOTE — Progress Notes (Signed)
Patient ID: Marco Stevenson, male   DOB: 04-15-1955, 63 y.o.   MRN: [id number]  PROGRESS NOTE    Marco Stevenson  WUJ:[id number] DOB: Oct 28, 1955 DOA: 05/11/2018 PCP: Gareth Morgan, MD   Brief Narrative:  63 year old male with history of GERD, peptic ulcer disease, previous GI bleed, hypertension presented on 05/10/2020 to [facility] with melanotic stools left bloody diarrhea and 2 syncopal episodes.  GI was consulted.  He was started on IV Protonix.  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  He was transferred to [facility] for possibility of EUS.  Assessment & Plan:   Principal Problem:   UGI bleed Active Problems:   Hypertension   ETOH abuse   Acid reflux   Hypomagnesemia   Acute blood loss anemia   Syncopal episodes  Upper GI bleeding in a patient with history of recurrent upper GI bleeding -Status post EGD on 05/12/2018 which showed multiple submucosal gastric lesions and ampullary mass. He was transferred to [facility] for possibility of EUS. -Currently on Protonix drip.  GI follow-up appreciated.  CT of the abdomen and pelvis did not show any evidence of soft tissue mass or metastatic disease.  Patient is scheduled to have repeat EGD today.  Will follow further GI recommendations.  Syncopal episodes -Most likely secondary to vasovagal from GI bleeding along with dehydration. -No further events  Alcohol abuse--continue CIWA protocol.  Continue multivitamin, thiamine and folic acid.  Essential hypertension -Monitor blood pressure.  Losartan on hold   DVT prophylaxis: SCDs Code Status: Full Family Communication: None at bedside Disposition Plan: Depends on clinical outcome  Consultants: GI  Procedures:  EGD on 05/12/2018 Findings:      The examined esophagus was normal. NO VARICES.      LARGE AMOUNT OF OLD BLOOD IN THE STOMACH. NO FRESH BLOOD OR CLOTS.       LIMITED VISUALIZATION.      Multiple  10-15 mm semi-sessile polypoid llesions with central ulceration       with no bleeding and stigmata of recent bleeding were found on the       greater curvature of the stomach. ONE polyp was removed with a hot       snare. Resection and retrieval were complete. Area was successfully       injected with 1 mL of a 1:10,000 solution of epinephrine for drug       delivery PRIOR TO POLYPECTOMY.      A medium-sized polypoid mass with no bleeding was found in the area of       the papilla AND OCCUPIES ~1/3 OF THE LUMEN. Impression:               - MELENA DUE TO MULTIPLE SUBMUCOSAL GASTRIC                            LESIONS. DIFFERENTIAL DIAGNOSIS INCLUDES:                            NEUROENDOCRINE TUMOR OR GIST.                           - ONE gastric polyps. Resected and retrieved.                           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 Moderate Sedation:      Per Anesthesia Care Recommendation:           - Return patient to hospital ward for ongoing care.                           - Full liquid diet.                           - Continue present medications.                           - Await pathology results.                           - DISCUSSED WITH DRs. SCHOOLER AND JOHNSON. WILL                            TRANSFER TO [facility] FOR EUS.                           - Return to my office in 4 months.   Antimicrobials:  None  Subjective: Patient seen and examined at bedside.  He feels that his black stools are slowing down.  Had a dark bowel movement this morning.  No abdominal pain or nausea.  No overnight fever. Objective: Vitals:   05/13/18 1322 05/13/18 2144 05/14/18 0534 05/14/18 0819  BP: 118/80 117/79 116/80 132/82  Pulse: 75 79 74 74  Resp: 15 18 18 13   Temp: 98.8 F (37.1 C) 98.7 F (37.1 C) 98.9 F (37.2 C) 98.8 F (37.1 C)  TempSrc: Oral Oral Oral Oral  SpO2: 100% 99% 100% 98%  Weight:   98 kg   Height:          Intake/Output Summary (Last 24 hours) at 05/14/2018 0826 Last data filed at 05/14/2018 3785 Gross per 24 hour  Intake 3855.77 ml  Output --  Net 3855.77 ml   Filed Weights   05/12/18 1736 05/13/18 0445 05/14/18 0534  Weight: 96.9 kg 99.3 kg 98 kg    Examination:  General exam: Appears calm and comfortable, no distress Respiratory system: Bilateral decreased breath sounds at bases, no wheezing Cardiovascular system: S1 & S2 heard, Rate controlled Gastrointestinal system: Abdomen is nondistended, soft and nontender. Normal bowel sounds heard. Extremities: No cyanosis, edema    Data Reviewed: I have personally reviewed following labs and imaging studies  CBC: Recent Labs  Lab 05/11/18 2020 05/12/18 0508 05/12/18 1952 05/13/18 0758 05/14/18 0400  WBC 14.4* 9.7  --  7.8 5.9  NEUTROABS 10.5*  --   --  3.9 2.8  HGB 12.2* 9.9* 9.4* 9.5* 8.4*  HCT 35.7* 28.8* 28.5* 27.8* 25.7*  MCV 91.8 92.3  --  93.0 92.4  PLT 258 173  --  161 126*   Basic Metabolic Panel: Recent Labs  Lab 05/11/18 2020 05/12/18 0508 05/13/18 0758 05/14/18 0400  NA 137 138 138 139  K 3.9 3.9 4.0 3.4*  CL 108 111 109 109  CO2 20* 21* 22 23  GLUCOSE 179* 104* 107* 91  BUN 27* 32* 11 <5*  CREATININE 0.86 0.64 0.97 0.94  CALCIUM 8.2* 7.6* 8.0* 8.0*  MG 1.6*  --   --  2.1  PHOS 3.2  --   --   --    GFR: Estimated Creatinine Clearance: 98.1 mL/min (by C-G formula based on SCr of 0.94 mg/dL). Liver Function Tests: Recent Labs  Lab 05/11/18 2020 05/12/18 0508  AST 22 19  ALT 28 23  ALKPHOS 50 37*  BILITOT 0.7 0.4  PROT 5.9* 5.0*  ALBUMIN 3.3* 2.9*   Recent Labs  Lab 05/11/18 2020  LIPASE 33   No results for input(s): AMMONIA in the last 168 hours. Coagulation Profile: Recent Labs  Lab 05/11/18 2020  INR 1.1   Cardiac Enzymes: Recent Labs  Lab 05/11/18 2020 05/12/18 0508  TROPONINI <0.03 <0.03   BNP (last 3 results) No results for input(s): PROBNP in the last 8760  hours. HbA1C: No results for input(s): HGBA1C in the last 72 hours. CBG: Recent Labs  Lab 05/11/18 2029 05/12/18 0708  GLUCAP 166* 88   Lipid Profile: No results for input(s): CHOL, HDL, LDLCALC, TRIG, CHOLHDL, LDLDIRECT in the last 72 hours. Thyroid Function Tests: No results for input(s): TSH, T4TOTAL, FREET4, T3FREE, THYROIDAB in the last 72 hours. Anemia Panel: No results for input(s): VITAMINB12, FOLATE, FERRITIN, TIBC, IRON, RETICCTPCT in the last 72 hours. Sepsis Labs: No results for input(s): PROCALCITON, LATICACIDVEN in the last 168 hours.  Recent Results (from the past 240 hour(s))  MRSA PCR Screening     Status: None   Collection Time: 05/11/18 11:07 PM  Result Value Ref Range Status   MRSA by PCR NEGATIVE NEGATIVE Final    Comment:        The GeneXpert MRSA Assay (FDA approved for NASAL specimens only), is one component of a comprehensive MRSA colonization surveillance program. It is not intended to diagnose MRSA infection nor to guide or monitor treatment for MRSA infections. Performed at [facility], [address] [place], [place] [address]          Radiology Studies: Ct Abdomen Pelvis W Contrast  Result Date: 05/13/2018 CLINICAL DATA:  GI bleeding. Gastric and duodenal polyps seen on recent endoscopy. EXAM: CT ABDOMEN AND PELVIS WITH CONTRAST TECHNIQUE: Multidetector CT imaging of the abdomen and pelvis was performed using the standard protocol following bolus administration of intravenous contrast. CONTRAST:  OMNIPAQUE IOHEXOL 300 MG/ML  SOLN COMPARISON:  12/06/2005 from [facility] FINDINGS: Lower Chest: No acute findings. Hepatobiliary: No hepatic masses identified. Mild-to-moderate diffuse hepatic steatosis. Gallbladder is unremarkable. No evidence of biliary ductal dilatation. Pancreas:  No mass or inflammatory changes. Spleen: Within normal limits in size and appearance. Adrenals/Urinary Tract: No masses identified. Stable tiny right  lower pole renal cyst. No evidence of hydronephrosis. Unremarkable unopacified urinary bladder. Stomach/Bowel: No soft tissue mass is seen involving stomach or bowel. Large diverticulum arising from transverse portion of the duodenum measuring 6.5 cm. No evidence of obstruction, inflammatory process or abnormal fluid collections. Diverticulosis is seen mainly involving the descending and sigmoid colon, however there is no evidence of diverticulitis. Normal appendix visualized. Vascular/Lymphatic: No pathologically enlarged lymph nodes. No abdominal aortic aneurysm. Reproductive:  No mass or other significant abnormality. Other:  None. Musculoskeletal:  No suspicious bone lesions identified. IMPRESSION: 1. No evidence of soft tissue mass or metastatic disease. 2. Large duodenal diverticulum. 3. Colonic diverticulosis, without radiographic evidence of diverticulitis. 4. Hepatic steatosis. Electronically Signed   By: Alver Sorrow.D.  On: 05/13/2018 15:46        Scheduled Meds:  [MAR Hold] folic acid  1 mg Oral Daily   [MAR Hold] multivitamin with minerals  1 tablet Oral Daily   [MAR Hold] sodium chloride flush  3 mL Intravenous Q12H   [MAR Hold] thiamine  100 mg Oral Daily   Continuous Infusions:  sodium chloride 100 mL/hr at 05/14/18 9381   pantoprozole (PROTONIX) infusion 8 mg/hr (05/14/18 0652)     LOS: 3 days        Glade Lloyd, MD [facility] 05/14/2018, 8:26 AM

## 2018-05-14 NOTE — Op Note (Signed)
[facility] Patient Name: Marco Stevenson Procedure Date : 05/14/2018 MRN: [id number] Attending MD: Vida Rigger , MD Date of Birth: 12/09/1955 CSN: [id number] Age: 63 Admit Type: Inpatient Procedure:                Upper GI endoscopy Indications:              Acute post hemorrhagic anemia, Melena, Polyps in                            the duodenum concern by other gastroenterologist                            that this was an ampullary mass Providers:                Vida Rigger, MD, Dwain Sarna, RN, Beryle Beams,                            Technician Referring MD:              Medicines:                Propofol total dose 300 mg IV, 80 mg lidocaine Complications:            No immediate complications. Estimated Blood Loss:     Estimated blood loss was minimal. Procedure:                Pre-Anesthesia Assessment:                           - Prior to the procedure, a History and Physical                            was performed, and patient medications and                            allergies were reviewed. The patient's tolerance of                            previous anesthesia was also reviewed. The risks                            and benefits of the procedure and the sedation                            options and risks were discussed with the patient.                            All questions were answered, and informed consent                            was obtained. Prior Anticoagulants: The patient has                            taken no previous anticoagulant or antiplatelet  agents except for aspirin. ASA Grade Assessment: II                            - A patient with mild systemic disease. After                            reviewing the risks and benefits, the patient was                            deemed in satisfactory condition to undergo the                            procedure.                           After obtaining informed  consent, the endoscope was                            passed under direct vision. Throughout the                            procedure, the patient's blood pressure, pulse, and                            oxygen saturations were monitored continuously. The                            [id number]-[id number] ([id number]) Olympus duodenoscope i.e.                            side-viewing scope was introduced through the                            mouth, and advanced to the second part of duodenum.                            The upper GI endoscopy was accomplished without                            difficulty. The patient tolerated the procedure                            well. Scope In: Scope Out: Findings:      The gastric antrum was normal. No blood was seen in the stomach      The ampulla, second portion of the duodenum and major papilla were       normal.      A single large pedunculated polyp with no bleeding was found in the       duodenal bulb. Biopsies were taken with a cold forceps for histology.      The exam was otherwise without abnormality.      A small diverticulum was found in the second portion of the duodenum. Impression:               - Normal antrum.                           -  Normal ampulla, second portion of the duodenum                            and major papilla.                           - A single duodenal polyp. Biopsied.                           - The examination was otherwise normal.                           - Duodenal diverticulum. Moderate Sedation:      moderate sedation-none Recommendation:           - Patient has a contact number available for                            emergencies. The signs and symptoms of potential                            delayed complications were discussed with the                            patient. Return to normal activities tomorrow.                            Written discharge instructions were provided to the                             patient.                           - Soft diet today. Can probably go home tomorrow if                            no signs of bleeding                           - Continue present medications. Warned about no                            aspirin or nonsteroidals and use Tylenol only and                            continue once a day pump inhibitor                           - Await pathology results.                           - Return to GI clinic PRN.                           - Telephone GI clinic for pathology results in 4  days.                           - Telephone GI clinic if symptomatic PRN.                           - Perform an upper endoscopic ultrasound (UEUS) at                            appointment to be scheduled after biopsies are back                            just to make sure stalk does not have a large                            feeder vessel which that concern kept [place] from                            snaring the polyp today Procedure Code(s):        --- Professional ---                           [phone], Esophagogastroduodenoscopy, flexible,                            transoral; with biopsy, single or multiple Diagnosis Code(s):        --- Professional ---                           K31.7, Polyp of stomach and duodenum                           D62, Acute posthemorrhagic anemia                           K92.1, Melena (includes Hematochezia) CPT copyright 2019 American Medical Association. All rights reserved. The codes documented in this report are preliminary and upon coder review may  be revised to meet current compliance requirements. Vida Rigger, MD 05/14/2018 10:40:22 AM This report has been signed electronically. Number of Addenda: 0

## 2018-05-15 ENCOUNTER — Encounter (HOSPITAL_COMMUNITY): Payer: Self-pay | Admitting: Gastroenterology

## 2018-05-15 LAB — BASIC METABOLIC PANEL
Anion gap: 7 (ref 5–15)
BUN: 5 mg/dL — ABNORMAL LOW (ref 6–20)
CO2: 24 mmol/L (ref 22–32)
Calcium: 8.3 mg/dL — ABNORMAL LOW (ref 8.9–10.3)
Chloride: 108 mmol/L (ref 98–111)
Creatinine, Ser: 0.98 mg/dL (ref 0.61–1.24)
GFR calc Af Amer: >60 mL/min (ref >60)
GFR calc non Af Amer: >60 mL/min (ref >60)
Glucose, Bld: 95 mg/dL (ref 70–99)
Potassium: 3.4 mmol/L — ABNORMAL LOW (ref 3.5–5.1)
Sodium: 139 mmol/L (ref 135–145)

## 2018-05-15 LAB — CBC WITH DIFFERENTIAL/PLATELET
Abs Immature Granulocytes: 0.02 10*3/uL (ref 0.00–0.07)
Basophils Absolute: 0 10*3/uL (ref 0.0–0.1)
Basophils Relative: 1 %
Eosinophils Absolute: 0.1 10*3/uL (ref 0.0–0.5)
Eosinophils Relative: 3 %
HCT: 25.8 % — ABNORMAL LOW (ref 39.0–52.0)
Hemoglobin: 8.5 g/dL — ABNORMAL LOW (ref 13.0–17.0)
Immature Granulocytes: 0 %
Lymphocytes Relative: 34 %
Lymphs Abs: 1.9 10*3/uL (ref 0.7–4.0)
MCH: 30.6 pg (ref 26.0–34.0)
MCHC: 32.9 g/dL (ref 30.0–36.0)
MCV: 92.8 fL (ref 80.0–100.0)
Monocytes Absolute: 0.9 10*3/uL (ref 0.1–1.0)
Monocytes Relative: 17 %
Neutro Abs: 2.5 10*3/uL (ref 1.7–7.7)
Neutrophils Relative %: 45 %
Platelets: 143 10*3/uL — ABNORMAL LOW (ref 150–400)
RBC: 2.78 MIL/uL — ABNORMAL LOW (ref 4.22–5.81)
RDW: 12.5 % (ref 11.5–15.5)
WBC: 5.5 10*3/uL (ref 4.0–10.5)
nRBC: 0 % (ref 0.0–0.2)

## 2018-05-15 LAB — MAGNESIUM: Magnesium: 2.3 mg/dL (ref 1.7–2.4)

## 2018-05-15 MED ORDER — POTASSIUM CHLORIDE CRYS ER 20 MEQ PO TBCR
40.0000 meq | EXTENDED_RELEASE_TABLET | Freq: Once | ORAL | Status: AC
  Administered 2018-05-15: 40 meq via ORAL
  Filled 2018-05-15: qty 2

## 2018-05-15 MED ORDER — FOLIC ACID 1 MG PO TABS: 1.0000 mg | ORAL_TABLET | Freq: Every day | ORAL | 0 refills | Status: DC

## 2018-05-15 MED ORDER — THIAMINE HCL 100 MG PO TABS: 100.0000 mg | ORAL_TABLET | Freq: Every day | ORAL | 0 refills | Status: DC

## 2018-05-15 MED ORDER — ADULT MULTIVITAMIN W/MINERALS CH: 1.0000 | ORAL_TABLET | Freq: Every day | ORAL | Status: DC

## 2018-05-15 MED ORDER — ONDANSETRON HCL 4 MG PO TABS: 4.0000 mg | ORAL_TABLET | Freq: Four times a day (QID) | ORAL | 0 refills | Status: DC | PRN

## 2018-05-15 MED ORDER — PANTOPRAZOLE SODIUM 40 MG PO TBEC: 40.0000 mg | DELAYED_RELEASE_TABLET | Freq: Every day | ORAL | 0 refills | Status: DC

## 2018-05-15 NOTE — Progress Notes (Signed)
Patient ID: Marco Stevenson, male   DOB: 01-Jul-1955, 63 y.o.   MRN: [id number]  Sitting in bedside chair eating breakfast. Feels good. Being discharged today. Dr. Hulen Shouts office will call to arrange an outpt EUS to evaluate the large pedunculated duodenal polyp to check and see if large blood vessel in the stalk. Biopsies pending.

## 2018-05-15 NOTE — Discharge Summary (Addendum)
Physician Discharge Summary  Marco Stevenson WOE:[id number] DOB: 1955-05-31 DOA: 05/11/2018  PCP: Gareth Morgan, MD  Admit date: 05/11/2018 Discharge date: 05/15/2018  Admitted From: Home Disposition: Home  Recommendations for Outpatient Follow-up:  1. Follow up with PCP in 1 week repeat CBC/BMP 2. Follow-up with GI as an outpatient 3. Abstain from alcohol 4. Avoid NSAIDs. 5. Follow up in ED if symptoms worsen or new appear   Home Health: No Equipment/Devices: None  Discharge Condition: Stable CODE STATUS: Full Diet recommendation: Heart healthy  Brief/Interim Summary: 63 year old male with history of GERD, peptic ulcer disease, previous GI bleed, hypertension presented on 05/10/2020 to [facility] with melanotic stools left bloody diarrhea and 2 syncopal episodes.  GI was consulted.  He was started on IV Protonix.  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  He was transferred to [facility] for possibility of EUS.  He had repeat EGD on 05/14/2018.  No further episodes of bleeding.  He is tolerating diet. GI has cleared him for discharge.  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 He was transferred to [facility] for possibility of EUS. -Treated with Protonix drip. -GI follow-up appreciated.  CT of the abdomen and pelvis did not show any evidence of soft tissue mass or metastatic disease.   -Patient had repeat EGD on 05/14/2018 which showed a single duodenal polyp which was biopsied along with duodenal diverticulum.  Pathology is pending.  GI has cleared the patient for discharge on oral Protonix 40 mg daily.   Avoid NSAIDs.  Patient will need outpatient GI follow-up and might need outpatient EUS.  Acute blood loss anemia -From above.  Hemoglobin is 8.5 today.  Outpatient follow-up.  Syncopal episodes -Most likely secondary to vasovagal from GI bleeding along with dehydration. -No further events -Outpatient follow-up  Alcohol abuse--treated with CIWA protocol.  Continue multivitamin, thiamine and folic acid on discharge. -Counseled about alcohol abstinence  Essential hypertension -Outpatient follow-up.    Resume losartan.   Discharge Instructions  Discharge Instructions    Call MD for:  extreme fatigue   Complete by:  As directed    Call MD for:  persistant dizziness or light-headedness   Complete by:  As directed    Call MD for:  persistant nausea and vomiting   Complete by:  As directed    Diet - low sodium heart healthy   Complete by:  As directed    Increase activity slowly   Complete by:  As directed      Allergies as of 05/15/2018   No Known Allergies     Medication List    TAKE these medications   ALPRAZolam 1 MG tablet Commonly known as:  XANAX Take 0.5-1 mg by mouth 3 (three) times a week. At bedtime depending on work schedule   folic acid 1 MG tablet Commonly known as:  FOLVITE Take 1 tablet (1 mg total) by mouth daily.   losartan 100 MG tablet Commonly known as:  COZAAR Take 100 mg by mouth daily.   multivitamin with minerals Tabs tablet Take 1 tablet by mouth daily.   ondansetron 4 MG tablet Commonly  known as:  ZOFRAN Take 1 tablet (4 mg total) by mouth every 6 (six) hours as needed for nausea.   pantoprazole 40 MG tablet Commonly known as:  PROTONIX Take 1 tablet (40 mg total) by mouth daily.   sildenafil 25 MG tablet Commonly known as:  VIAGRA Take 25 mg by mouth as needed for erectile dysfunction.   thiamine 100 MG tablet Take 1 tablet (100 mg total) by mouth daily.      Follow-up Information    Gareth Morgan, MD. Schedule an  appointment as soon as possible for a visit in 1 week(s).   Specialty:  Family Medicine Why:  With repeat CBC/BMP Contact information: [address] [place] [address] [phone]        Vida Rigger, MD. Schedule an appointment as soon as possible for a visit in 1 week(s).   Specialty:  Gastroenterology Contact information: [address] [place] [address] [phone]          No Known Allergies  Consultations:  GI   Procedures/Studies: Ct Head Wo Contrast  Result Date: 05/11/2018 CLINICAL DATA:  Head trauma EXAM: CT HEAD WITHOUT CONTRAST TECHNIQUE: Contiguous axial images were obtained from the base of the skull through the vertex without intravenous contrast. COMPARISON:  None. FINDINGS: Brain: There is no mass, hemorrhage or extra-axial collection. The size and configuration of the ventricles and extra-axial CSF spaces are normal. The brain parenchyma is normal, without acute or chronic infarction. Vascular: No abnormal hyperdensity of the major intracranial arteries or dural venous sinuses. No intracranial atherosclerosis. Skull: The visualized skull base, calvarium and extracranial soft tissues are normal. Sinuses/Orbits: No fluid levels or advanced mucosal thickening of the visualized paranasal sinuses. No mastoid or middle ear effusion. The orbits are normal. IMPRESSION: Normal head CT. Electronically Signed   By: Deatra Robinson M.D.   On: 05/11/2018 23:10   Ct Abdomen Pelvis W Contrast  Result Date: 05/13/2018 CLINICAL DATA:  GI bleeding. Gastric and duodenal polyps seen on recent endoscopy. EXAM: CT ABDOMEN AND PELVIS WITH CONTRAST TECHNIQUE: Multidetector CT imaging of the abdomen and pelvis was performed using the standard protocol following bolus administration of intravenous contrast. CONTRAST:  OMNIPAQUE IOHEXOL 300 MG/ML  SOLN COMPARISON:  12/06/2005 from [facility] FINDINGS: Lower Chest: No acute findings. Hepatobiliary:  No hepatic masses identified. Mild-to-moderate diffuse hepatic steatosis. Gallbladder is unremarkable. No evidence of biliary ductal dilatation. Pancreas:  No mass or inflammatory changes. Spleen: Within normal limits in size and appearance. Adrenals/Urinary Tract: No masses identified. Stable tiny right lower pole renal cyst. No evidence of hydronephrosis. Unremarkable unopacified urinary bladder. Stomach/Bowel: No soft tissue mass is seen involving stomach or bowel. Large diverticulum arising from transverse portion of the duodenum measuring 6.5 cm. No evidence of obstruction, inflammatory process or abnormal fluid collections. Diverticulosis is seen mainly involving the descending and sigmoid colon, however there is no evidence of diverticulitis. Normal appendix visualized. Vascular/Lymphatic: No pathologically enlarged lymph nodes. No abdominal aortic aneurysm. Reproductive:  No mass or other significant abnormality. Other:  None. Musculoskeletal:  No suspicious bone lesions identified. IMPRESSION: 1. No evidence of soft tissue mass or metastatic disease. 2. Large duodenal diverticulum. 3. Colonic diverticulosis, without radiographic evidence of diverticulitis. 4. Hepatic steatosis. Electronically Signed   By: Myles Rosenthal M.D.   On: 05/13/2018 15:46    EGD on 05/14/2018 Findings:      The gastric antrum was normal. No blood was seen in the stomach  The ampulla, second portion of the duodenum and major papilla were       normal.      A single large pedunculated polyp with no bleeding was found in the       duodenal bulb. Biopsies were taken with a cold forceps for histology.      The exam was otherwise without abnormality.      A small diverticulum was found in the second portion of the duodenum. Impression:               - Normal antrum.                           - Normal ampulla, second portion of the duodenum                            and major papilla.                           - A single  duodenal polyp. Biopsied.                           - The examination was otherwise normal.                           - Duodenal diverticulum. Moderate Sedation:      moderate sedation-none Recommendation:           - Patient has a contact number available for                            emergencies. The signs and symptoms of potential                            delayed complications were discussed with the                            patient. Return to normal activities tomorrow.                            Written discharge instructions were provided to the                            patient.                           - Soft diet today. Can probably go home tomorrow if                            no signs of bleeding                           - Continue present medications. Warned about no                            aspirin or nonsteroidals and use Tylenol only and  continue once a day pump inhibitor                           - Await pathology results.                           - Return to GI clinic PRN.                           - Telephone GI clinic for pathology results in 4                            days.                           - Telephone GI clinic if symptomatic PRN.                           - Perform an upper endoscopic ultrasound (UEUS) at                            appointment to be scheduled after biopsies are back                            just to make sure stalk does not have a large                            feeder vessel which that concern kept us from                            snaring the polyp today   Subjective: Patient seen and examined at bedside.  He denies any overnight fever, nausea, vomiting, black or bloody stools.  Feels okay to go home.  Discharge Exam: Vitals:   05/14/18 2027 05/15/18 0541  BP: 131/85 109/74  Pulse: 84 69  Resp: 18 17  Temp: 98.1 F (36.7 C) 97.7 F (36.5 C)  SpO2: 98% 96%   Vitals:   05/14/18 1109  05/14/18 2027 05/15/18 0541 05/15/18 0551  BP: 131/81 131/85 109/74   Pulse: 75 84 69   Resp: (!) 23 18 17    Temp: 97.8 F (36.6 C) 98.1 F (36.7 C) 97.7 F (36.5 C)   TempSrc: Oral Oral    SpO2: 100% 98% 96%   Weight:    93.5 kg  Height:        General: Pt is alert, awake, not in acute distress Cardiovascular: rate controlled, S1/S2 + Respiratory: bilateral decreased breath sounds at bases Abdominal: Soft, NT, ND, bowel sounds + Extremities: no edema, no cyanosis    The results of significant diagnostics from this hospitalization (including imaging, microbiology, ancillary and laboratory) are listed below for reference.     Microbiology: Recent Results (from the past 240 hour(s))  MRSA PCR Screening     Status: None   Collection Time: 05/11/18 11:07 PM  Result Value Ref Range Status   MRSA by PCR NEGATIVE NEGATIVE Final    Comment:        The GeneXpert MRSA Assay (FDA approved for NASAL specimens only), is one component of a comprehensive  MRSA colonization surveillance program. It is not intended to diagnose MRSA infection nor to guide or monitor treatment for MRSA infections. Performed at [facility], [address], [place], [place] [address]      Labs: BNP (last 3 results) No results for input(s): BNP in the last 8760 hours. Basic Metabolic Panel: Recent Labs  Lab 05/11/18 2020 05/12/18 0508 05/13/18 0758 05/14/18 0400 05/15/18 0249  NA 137 138 138 139 139  K 3.9 3.9 4.0 3.4* 3.4*  CL 108 111 109 109 108  CO2 20* 21* 22 23 24   GLUCOSE 179* 104* 107* 91 95  BUN 27* 32* 11 <5* 5*  CREATININE 0.86 0.64 0.97 0.94 0.98  CALCIUM 8.2* 7.6* 8.0* 8.0* 8.3*  MG 1.6*  --   --  2.1 2.3  PHOS 3.2  --   --   --   --    Liver Function Tests: Recent Labs  Lab 05/11/18 2020 05/12/18 0508  AST 22 19  ALT 28 23  ALKPHOS 50 37*  BILITOT 0.7 0.4  PROT 5.9* 5.0*  ALBUMIN 3.3* 2.9*   Recent Labs  Lab 05/11/18 2020  LIPASE 33   No results for input(s):  AMMONIA in the last 168 hours. CBC: Recent Labs  Lab 05/11/18 2020 05/12/18 0508 05/12/18 1952 05/13/18 0758 05/14/18 0400 05/15/18 0249  WBC 14.4* 9.7  --  7.8 5.9 5.5  NEUTROABS 10.5*  --   --  3.9 2.8 2.5  HGB 12.2* 9.9* 9.4* 9.5* 8.4* 8.5*  HCT 35.7* 28.8* 28.5* 27.8* 25.7* 25.8*  MCV 91.8 92.3  --  93.0 92.4 92.8  PLT 258 173  --  161 126* 143*   Cardiac Enzymes: Recent Labs  Lab 05/11/18 2020 05/12/18 0508  TROPONINI <0.03 <0.03   BNP: Invalid input(s): POCBNP CBG: Recent Labs  Lab 05/11/18 2029 05/12/18 0708  GLUCAP 166* 88   D-Dimer No results for input(s): DDIMER in the last 72 hours. Hgb A1c No results for input(s): HGBA1C in the last 72 hours. Lipid Profile No results for input(s): CHOL, HDL, LDLCALC, TRIG, CHOLHDL, LDLDIRECT in the last 72 hours. Thyroid function studies No results for input(s): TSH, T4TOTAL, T3FREE, THYROIDAB in the last 72 hours.  Invalid input(s): FREET3 Anemia work up No results for input(s): VITAMINB12, FOLATE, FERRITIN, TIBC, IRON, RETICCTPCT in the last 72 hours. Urinalysis No results found for: COLORURINE, APPEARANCEUR, LABSPEC, PHURINE, GLUCOSEU, HGBUR, BILIRUBINUR, KETONESUR, PROTEINUR, UROBILINOGEN, NITRITE, LEUKOCYTESUR Sepsis Labs Invalid input(s): PROCALCITONIN,  WBC,  LACTICIDVEN Microbiology Recent Results (from the past 240 hour(s))  MRSA PCR Screening     Status: None   Collection Time: 05/11/18 11:07 PM  Result Value Ref Range Status   MRSA by PCR NEGATIVE NEGATIVE Final    Comment:        The GeneXpert MRSA Assay (FDA approved for NASAL specimens only), is one component of a comprehensive MRSA colonization surveillance program. It is not intended to diagnose MRSA infection nor to guide or monitor treatment for MRSA infections. Performed at [facility], [address], [place], [place] [address]      Time coordinating discharge: 35 minutes  SIGNED:   Glade Lloyd, MD  [facility] 05/15/2018, 8:35 AM

## 2018-05-18 NOTE — Progress Notes (Signed)
LMOM to call. Mailing letter also.

## 2018-05-18 NOTE — Progress Notes (Signed)
Letter mailed

## 2018-09-01 ENCOUNTER — Other Ambulatory Visit: Payer: Self-pay | Admitting: Gastroenterology

## 2018-09-19 ENCOUNTER — Other Ambulatory Visit (HOSPITAL_COMMUNITY): Admission: RE | Admit: 2018-09-19 | Payer: Commercial Managed Care - PPO | Source: Ambulatory Visit

## 2018-09-21 ENCOUNTER — Other Ambulatory Visit (HOSPITAL_COMMUNITY)
Admission: RE | Admit: 2018-09-21 | Discharge: 2018-09-21 | Disposition: A | Discharge status: Home / Self Care | Payer: Commercial Managed Care - PPO | Source: Ambulatory Visit | Attending: Gastroenterology | Admitting: Gastroenterology

## 2018-09-21 ENCOUNTER — Encounter (HOSPITAL_COMMUNITY): Payer: Self-pay | Admitting: *Deleted

## 2018-09-21 DIAGNOSIS — Z20828 Contact with and (suspected) exposure to other viral communicable diseases: Secondary | ICD-10-CM | POA: Diagnosis not present

## 2018-09-21 DIAGNOSIS — Z01812 Encounter for preprocedural laboratory examination: Secondary | ICD-10-CM | POA: Diagnosis not present

## 2018-09-21 LAB — SARS CORONAVIRUS 2 (TAT 6-24 HRS): SARS Coronavirus 2: NEGATIVE

## 2018-09-22 NOTE — Progress Notes (Signed)
Spoke with patient, verbalized understanding covid 19 test is negative, has been able to quarantine, no new symptoms.  Verbalize understanding NPO after midnight.  Arrival time 0800.

## 2018-09-23 ENCOUNTER — Ambulatory Visit (HOSPITAL_COMMUNITY)
Admission: RE | Admit: 2018-09-23 | Discharge: 2018-09-23 | Disposition: A | Discharge status: Home / Self Care | Payer: Commercial Managed Care - PPO | Source: Ambulatory Visit | Attending: Gastroenterology | Admitting: Gastroenterology

## 2018-09-23 ENCOUNTER — Other Ambulatory Visit: Payer: Self-pay

## 2018-09-23 ENCOUNTER — Ambulatory Visit (HOSPITAL_COMMUNITY): Payer: Commercial Managed Care - PPO | Admitting: Anesthesiology

## 2018-09-23 ENCOUNTER — Encounter (HOSPITAL_COMMUNITY): Payer: Self-pay | Admitting: *Deleted

## 2018-09-23 ENCOUNTER — Encounter (HOSPITAL_COMMUNITY)
Admission: RE | Disposition: A | Discharge status: Home / Self Care | Payer: Self-pay | Source: Ambulatory Visit | Attending: Gastroenterology

## 2018-09-23 DIAGNOSIS — Z79899 Other long term (current) drug therapy: Secondary | ICD-10-CM | POA: Diagnosis not present

## 2018-09-23 DIAGNOSIS — F419 Anxiety disorder, unspecified: Secondary | ICD-10-CM | POA: Diagnosis not present

## 2018-09-23 DIAGNOSIS — K449 Diaphragmatic hernia without obstruction or gangrene: Secondary | ICD-10-CM | POA: Insufficient documentation

## 2018-09-23 DIAGNOSIS — K219 Gastro-esophageal reflux disease without esophagitis: Secondary | ICD-10-CM | POA: Insufficient documentation

## 2018-09-23 DIAGNOSIS — K317 Polyp of stomach and duodenum: Secondary | ICD-10-CM | POA: Insufficient documentation

## 2018-09-23 DIAGNOSIS — I10 Essential (primary) hypertension: Secondary | ICD-10-CM | POA: Diagnosis not present

## 2018-09-23 DIAGNOSIS — D500 Iron deficiency anemia secondary to blood loss (chronic): Secondary | ICD-10-CM | POA: Insufficient documentation

## 2018-09-23 DIAGNOSIS — K5710 Diverticulosis of small intestine without perforation or abscess without bleeding: Secondary | ICD-10-CM | POA: Diagnosis not present

## 2018-09-23 HISTORY — PX: EUS: SHX5427

## 2018-09-23 HISTORY — PX: SUBMUCOSAL INJECTION: SHX5543

## 2018-09-23 HISTORY — PX: ESOPHAGOGASTRODUODENOSCOPY (EGD) WITH PROPOFOL: SHX5813

## 2018-09-23 HISTORY — PX: POLYPECTOMY: SHX5525

## 2018-09-23 LAB — TYPE AND SCREEN
ABO/RH(D): B POS
Antibody Screen: NEGATIVE

## 2018-09-23 LAB — CBC
HCT: 37.7 % — ABNORMAL LOW (ref 39.0–52.0)
Hemoglobin: 10.8 g/dL — ABNORMAL LOW (ref 13.0–17.0)
MCH: 20.7 pg — ABNORMAL LOW (ref 26.0–34.0)
MCHC: 28.6 g/dL — ABNORMAL LOW (ref 30.0–36.0)
MCV: 72.4 fL — ABNORMAL LOW (ref 80.0–100.0)
Platelets: 231 10*3/uL (ref 150–400)
RBC: 5.21 MIL/uL (ref 4.22–5.81)
RDW: 22.5 % — ABNORMAL HIGH (ref 11.5–15.5)
WBC: 6.7 10*3/uL (ref 4.0–10.5)
nRBC: 0 % (ref 0.0–0.2)

## 2018-09-23 LAB — ABO/RH: ABO/RH(D): B POS

## 2018-09-23 SURGERY — ESOPHAGOGASTRODUODENOSCOPY (EGD) WITH PROPOFOL
Anesthesia: Monitor Anesthesia Care

## 2018-09-23 MED ORDER — PROPOFOL 10 MG/ML IV BOLUS
INTRAVENOUS | Status: AC
  Filled 2018-09-23: qty 20

## 2018-09-23 MED ORDER — LIDOCAINE 2% (20 MG/ML) 5 ML SYRINGE
INTRAMUSCULAR | Status: DC | PRN
  Administered 2018-09-23: 40 mg via INTRAVENOUS

## 2018-09-23 MED ORDER — LACTATED RINGERS IV SOLN
INTRAVENOUS | Status: DC
  Administered 2018-09-23: 1000 mL via INTRAVENOUS
  Administered 2018-09-23: 10:00:00 via INTRAVENOUS

## 2018-09-23 MED ORDER — PROPOFOL 10 MG/ML IV BOLUS
INTRAVENOUS | Status: AC
  Filled 2018-09-23: qty 60

## 2018-09-23 MED ORDER — PROPOFOL 500 MG/50ML IV EMUL
INTRAVENOUS | Status: DC | PRN
  Administered 2018-09-23: 125 ug/kg/min via INTRAVENOUS

## 2018-09-23 MED ORDER — EPINEPHRINE 1 MG/10ML IJ SOSY
PREFILLED_SYRINGE | INTRAMUSCULAR | Status: AC
  Filled 2018-09-23: qty 10

## 2018-09-23 MED ORDER — PROPOFOL 10 MG/ML IV BOLUS
INTRAVENOUS | Status: DC | PRN
  Administered 2018-09-23: 20 mg via INTRAVENOUS
  Administered 2018-09-23: 30 mg via INTRAVENOUS

## 2018-09-23 MED ORDER — SODIUM CHLORIDE (PF) 0.9 % IJ SOLN
PREFILLED_SYRINGE | INTRAMUSCULAR | Status: DC | PRN
  Administered 2018-09-23: 4 mL

## 2018-09-23 MED ORDER — LACTATED RINGERS IV SOLN: INTRAVENOUS | Status: DC

## 2018-09-23 MED ORDER — SODIUM CHLORIDE 0.9 % IV SOLN: INTRAVENOUS | Status: DC

## 2018-09-23 SURGICAL SUPPLY — 14 items

## 2018-09-23 NOTE — Anesthesia Preprocedure Evaluation (Addendum)
Anesthesia Evaluation  Patient identified by MRN, date of birth, ID band Patient awake    Reviewed: Allergy & Precautions, NPO status , Patient's Chart, lab work & pertinent test results  Airway Mallampati: II  TM Distance: >3 FB Neck ROM: Full    Dental no notable dental hx. (+) Chipped, Dental Advisory Given,    Pulmonary neg pulmonary ROS,    Pulmonary exam normal breath sounds clear to auscultation       Cardiovascular hypertension, Pt. on medications negative cardio ROS Normal cardiovascular exam Rhythm:Regular Rate:Normal     Neuro/Psych PSYCHIATRIC DISORDERS Anxiety negative neurological ROS     GI/Hepatic GERD  Medicated,(+)     substance abuse  alcohol use,   Endo/Other  negative endocrine ROS  Renal/GU negative Renal ROS  negative genitourinary   Musculoskeletal negative musculoskeletal ROS (+)   Abdominal   Peds  Hematology  (+) Blood dyscrasia, anemia ,   Anesthesia Other Findings Duodenal polyps  Reproductive/Obstetrics                            Anesthesia Physical Anesthesia Plan  ASA: III  Anesthesia Plan: MAC   Post-op Pain Management:    Induction: Intravenous  PONV Risk Score and Plan: Propofol infusion and Treatment may vary due to age or medical condition  Airway Management Planned: Natural Airway  Additional Equipment:   Intra-op Plan:   Post-operative Plan:   Informed Consent: I have reviewed the patients History and Physical, chart, labs and discussed the procedure including the risks, benefits and alternatives for the proposed anesthesia with the patient or authorized representative who has indicated his/her understanding and acceptance.     Dental advisory given  Plan Discussed with: CRNA  Anesthesia Plan Comments:         Anesthesia Quick Evaluation

## 2018-09-23 NOTE — Transfer of Care (Signed)
Immediate Anesthesia Transfer of Care Note  Patient: Marco Stevenson  Procedure(s) Performed: UPPER ENDOSCOPIC ULTRASOUND (EUS) LINEAR (N/A ) ESOPHAGOGASTRODUODENOSCOPY (EGD) WITH PROPOFOL (N/A ) POLYPECTOMY SUBMUCOSAL INJECTION  Patient Location: PACU  Anesthesia Type:MAC  Level of Consciousness: sedated  Airway & Oxygen Therapy: Patient Spontanous Breathing and Patient connected to nasal cannula oxygen  Post-op Assessment: Report given to RN and Post -op Vital signs reviewed and stable  Post vital signs: Reviewed and stable  Last Vitals:  Vitals Value Taken Time  BP    Temp    Pulse 79 09/23/18 1044  Resp 15 09/23/18 1044  SpO2 100 % 09/23/18 1044  Vitals shown include unvalidated device data.  Last Pain:  Vitals:   09/23/18 0846  TempSrc: Oral  PainSc: 0-No pain         Complications: No apparent anesthesia complications

## 2018-09-23 NOTE — Discharge Instructions (Signed)
[facility] ENDOSCOPY [address] [place], [place]  [address] Phone:  [phone]   September 23, 2018  Patient: Marco Stevenson  Date of Birth: 02/06/1956  Date of Visit: September 23, 2018    To Whom It May Concern:  Marco Stevenson was seen and treated on September 23, 2018 and  and may return to work on August 6th with no restrictions. Call [facility]  At [phone] with question.  .           If you have any questions or concerns, please don't hesitate to call.   Sincerely,       Treatment Team:  Attending Provider: Arta Silence, MD       YOU HAD AN ENDOSCOPIC PROCEDURE TODAY: Refer to the procedure report and other information in the discharge instructions given to you for any specific questions about what was found during the examination. If this information does not answer your questions, please call [facility] office at [phone] to clarify.   YOU SHOULD EXPECT: Some feelings of bloating in the abdomen. Passage of more gas than usual. Walking can help get rid of the air that was put into your GI tract during the procedure and reduce the bloating. If you had a lower endoscopy (such as a colonoscopy or flexible sigmoidoscopy) you may notice spotting of blood in your stool or on the toilet paper. Some abdominal soreness may be present for a day or two, also.  DIET: Your first meal following the procedure should be a light meal and then it is ok to progress to your normal diet. A half-sandwich or bowl of soup is an example of a good first meal. Heavy or fried foods are harder to digest and may make you feel nauseous or bloated. Drink plenty of fluids but you should avoid alcoholic beverages for 24 hours. If you had a esophageal dilation, please see attached instructions for diet.   ACTIVITY: Your care partner should take you home directly after the procedure. You should plan to take it easy, moving slowly for the rest of the day. You can  resume normal activity the day after the procedure however YOU SHOULD NOT DRIVE, use power tools, machinery or perform tasks that involve climbing or major physical exertion for 24 hours (because of the sedation medicines used during the test).   SYMPTOMS TO REPORT IMMEDIATELY: A gastroenterologist can be reached at any hour. Please call [phone]  for any of the following symptoms:   Following lower endoscopy (colonoscopy, flexible sigmoidoscopy) Excessive amounts of blood in the stool  Significant tenderness, worsening of abdominal pains  Swelling of the abdomen that is new, acute  Fever of 100 or higher   Following upper endoscopy (EGD, EUS, ERCP, esophageal dilation) Vomiting of blood or coffee ground material  New, significant abdominal pain  New, significant chest pain or pain under the shoulder blades  Painful or persistently difficult swallowing  New shortness of breath  Black, tarry-looking or red, bloody stools  FOLLOW UP:  If any biopsies were taken you will be contacted by phone or by letter within the next 1-3 weeks. Call [phone]  if you have not heard about the biopsies in 3 weeks.  Please also call with any specific questions about appointments or follow up tests. Soft solids only today and no aspirin or nonsteroidals going forward and specifically none in 2 weeks and begin 2 iron pills over-the-counter a day and they will turn your  stools black and possibly constipate you a little and call if question or problem otherwise call in 1 week for pathology and we will schedule follow-up colonoscopy at that time

## 2018-09-23 NOTE — Anesthesia Postprocedure Evaluation (Signed)
Anesthesia Post Note  Patient: Marco Stevenson  Procedure(s) Performed: UPPER ENDOSCOPIC ULTRASOUND (EUS) LINEAR (N/A ) ESOPHAGOGASTRODUODENOSCOPY (EGD) WITH PROPOFOL (N/A ) POLYPECTOMY SUBMUCOSAL INJECTION     Patient location during evaluation: Endoscopy Anesthesia Type: MAC Level of consciousness: awake and alert Pain management: pain level controlled Vital Signs Assessment: post-procedure vital signs reviewed and stable Respiratory status: spontaneous breathing, nonlabored ventilation, respiratory function stable and patient connected to nasal cannula oxygen Cardiovascular status: blood pressure returned to baseline and stable Postop Assessment: no apparent nausea or vomiting Anesthetic complications: no    Last Vitals:  Vitals:   09/23/18 1105 09/23/18 1110  BP: (!) 146/89 (!) 154/85  Pulse: 64 63  Resp: 16 15  Temp:    SpO2: 100% 100%    Last Pain:  Vitals:   09/23/18 1105  TempSrc:   PainSc: 0-No pain                 Chelsey L Woodrum

## 2018-09-23 NOTE — H&P (Signed)
Patient interval history reviewed.  Patient examined again.  There has been no change from documented H/P scanned into chart from our office) except as documented above.  Assessment:  1.  Duodenal pedunculated polyp.  Plan:  1.  Upper endoscopic ultrasound as well as planned upper endoscopy with polypectomy. 2.  Risks (bleeding, infection, bowel perforation that could require surgery, sedation-related changes in cardiopulmonary systems), benefits (identification and possible treatment of source of symptoms, exclusion of certain causes of symptoms), and alternatives (watchful waiting, radiographic imaging studies, empiric medical treatment) of upper endoscopy with ultrasound and upper endoscopy with anticipated duodenal polypectomy (EUS, EGD +/- polypectomy) were explained to patient/family in detail and patient wishes to proceed.

## 2018-09-23 NOTE — Anesthesia Procedure Notes (Signed)
Date/Time: 09/23/2018 9:40 AM Performed by: Talbot Grumbling, CRNA Oxygen Delivery Method: Nasal cannula

## 2018-09-23 NOTE — Op Note (Signed)
[facility] Patient Name: Marco SandiferBarry Stevenson Procedure Date: 09/23/2018 MRN: [id number] Attending MD: Marco Stevenson , MD Date of Birth: 04/20/1955 CSN: [id number] Age: 63 Admit Type: Outpatient Procedure:                Upper EUS Indications:              Duodenal mucosal mass/polyp found on endoscopy Providers:                Marco ModenaWilliam Outlaw, MD, Marco SarnaPatricia Ford, RN, Marco Muslimhris                            Stevenson Tech., Technician, Marco BurkeBeth Marshall, CRNA Referring MD:             Dr. Vida RiggerMarc Stevenson Medicines:                Monitored Anesthesia Care Complications:            No immediate complications. Estimated Blood Loss:     Estimated blood loss: none. Procedure:                Pre-Anesthesia Assessment:                           - Prior to the procedure, a History and Physical                            was performed, and patient medications and                            allergies were reviewed. The patient's tolerance of                            previous anesthesia was also reviewed. The risks                            and benefits of the procedure and the sedation                            options and risks were discussed with the patient.                            All questions were answered, and informed consent                            was obtained. Prior Anticoagulants: The patient has                            taken no previous anticoagulant or antiplatelet                            agents. ASA Grade Assessment: III - A patient with                            severe systemic disease. After reviewing the risks  and benefits, the patient was deemed in                            satisfactory condition to undergo the procedure.                           After obtaining informed consent, the endoscope was                            passed under direct vision. Throughout the                            procedure, the patient's blood pressure,  pulse, and                            oxygen saturations were monitored continuously. The                            [id number]-[id number] ([id number] Olympus Radial EUS was                            introduced through the mouth, and advanced to the                            second part of duodenum. The upper EUS was                            accomplished without difficulty. The patient                            tolerated the procedure well. Scope In: Scope Out: Findings:      ENDOSCOPIC FINDING: :      A single medium-sized pedunculated polyp with no bleeding was found in       the duodenal bulb.      ENDOSONOGRAPHIC FINDING: :      A hypoechoic pedunculated mass was identified endosonographically in the       duodenal bulb. The lesion appeared confined to the mucosa, without       extension into deeper wall layers. The endosonographic borders were       well-defined. Stalk about 5-6 mm in diameter, no exceedingly large       feeder vessels were noted within the polyp stalk. Impression:               - A single pedunculated duodenal polyp in proximal                            duodenal bulb. Moderate Sedation:      None Recommendation:           - Perform an upper GI endoscopy today with Dr.                            Ewing Stevenson for polypectomy. Procedure Code(s):        --- Professional ---  85277, Esophagogastroduodenoscopy, flexible,                            transoral; with endoscopic ultrasound examination                            limited to the esophagus, stomach or duodenum, and                            adjacent structures Diagnosis Code(s):        --- Professional ---                           K31.7, Polyp of stomach and duodenum                           K31.89, Other diseases of stomach and duodenum CPT copyright 2019 American [employer] Association. All rights reserved. The codes documented in this report are preliminary and upon coder review may  be  revised to meet current compliance requirements. Marco Silence, MD 09/23/2018 10:42:55 AM This report has been signed electronically. Number of Addenda: 0

## 2018-09-23 NOTE — Op Note (Signed)
[facility] Patient Name: Marco SandiferBarry Tse Procedure Date: 09/23/2018 MRN: [id number] Attending MD: Vida RiggerMarc Magod , MD Date of Birth: 08/15/1955 CSN: [id number] Age: [age] Admit Type: Outpatient Procedure:                Upper GI endoscopy Indications:              Polyps in the duodenum, For therapy of polyps in                            the duodenum after EUS ruled out significant                            feeding vessel Providers:                Dwain SarnaPatricia Ford, RN, Arlee Muslimhris Chandler Tech.,                            Technician, Doreene BurkeBeth Marshall, CRNA, Vida RiggerMarc Magod, MD Referring MD:              Medicines:                Monitored Anesthesia Care Complications:            No immediate complications. Estimated Blood Loss:     Estimated blood loss: none. Procedure:                Pre-Anesthesia Assessment:                           - Prior to the procedure, a History and Physical                            was performed, and patient medications and                            allergies were reviewed. The patient's tolerance of                            previous anesthesia was also reviewed. The risks                            and benefits of the procedure and the sedation                            options and risks were discussed with the patient.                            All questions were answered, and informed consent                            was obtained. Prior Anticoagulants: The patient has                            taken no previous anticoagulant or antiplatelet  agents. ASA Grade Assessment: III - A patient with                            severe systemic disease. After reviewing the risks                            and benefits, the patient was deemed in                            satisfactory condition to undergo the procedure.                           After obtaining informed consent, the endoscope was                            passed  under direct vision. Throughout the                            procedure, the patient's blood pressure, pulse, and                            oxygen saturations were monitored continuously. The                            [id number]-[id number] ([id number]) Olympus gastroscope was                            introduced through the mouth, and advanced to the                            duodenum and to recover the polyp which switched to                            the pediatric colonoscope ultra slim and then                            advanced to the jejunum. The upper GI endoscopy was                            somewhat difficult due to abnormal anatomy.                            Successful completion of the procedure was aided by                            withdrawing the scope and replacing with the                            pediatric colonoscope. The patient tolerated the                            procedure well. Scope In: Scope Out: Findings:      The larynx was normal.  A small hiatal hernia was present.      A few small semi-sessile polyps with no bleeding and no stigmata of       recent bleeding were found in the gastric body.      A single large pedunculated polyp with no bleeding was found in the       duodenal bulb. One ligature was successfully placed. There was no       bleeding during the procedure. Area was successfully injected with 4 mL       of a 1:10,000 solution of epinephrine for hemostasis. The polyp was       removed with a hot snare. Resection and retrieval were complete. We had       to switch to the pediatric colonoscope ultra slim as above to retrieve       the polyp from the jejunum using the Lucina MellowRoth net      The examined jejunum was normal.      A medium non-bleeding diverticulum was found in the third portion of the       duodenum. Impression:               - Normal larynx.                           - Small hiatal hernia.                           - A few gastric  polyps.                           - A single duodenal polyp. Resected and retrieved.                            Ligated. Injected.                           - Normal examined jejunum.                           - Non-bleeding duodenal diverticulum. Moderate Sedation:      Not Applicable - Patient had care per Anesthesia. Recommendation:           - Soft diet today.                           - Continue present medications.                           - Return to GI clinic PRN.                           - Telephone GI clinic for pathology results in 1                            week.                           - Telephone GI clinic if symptomatic PRN.                           -  Ferrous sulfate at 325 mg orally BID. Follow up                            with hemoglobin in 1 month.                           - Perform a colonoscopy at appointment to be                            scheduled. Procedure Code(s):        --- Professional ---                           [id number], 59, Esophagogastroduodenoscopy, flexible,                            transoral; with control of bleeding, any method                           43251, Esophagogastroduodenoscopy, flexible,                            transoral; with removal of tumor(s), polyp(s), or                            other lesion(s) by snare technique Diagnosis Code(s):        --- Professional ---                           K44.9, Diaphragmatic hernia without obstruction or                            gangrene                           K31.7, Polyp of stomach and duodenum CPT copyright 2019 American Medical Association. All rights reserved. The codes documented in this report are preliminary and upon coder review may  be revised to meet current compliance requirements. Vida RiggerMarc Magod, MD 09/23/2018 10:47:11 AM This report has been signed electronically. Number of Addenda: 0

## 2018-09-24 ENCOUNTER — Encounter (HOSPITAL_COMMUNITY): Payer: Self-pay | Admitting: Gastroenterology

## 2018-12-06 ENCOUNTER — Encounter (HOSPITAL_COMMUNITY): Payer: Self-pay

## 2018-12-06 ENCOUNTER — Emergency Department (HOSPITAL_COMMUNITY): Payer: Commercial Managed Care - PPO

## 2018-12-06 ENCOUNTER — Other Ambulatory Visit: Payer: Self-pay

## 2018-12-06 ENCOUNTER — Emergency Department (HOSPITAL_COMMUNITY)
Admission: EM | Admit: 2018-12-06 | Discharge: 2018-12-07 | Disposition: A | Discharge status: Home / Self Care | Payer: Commercial Managed Care - PPO | Attending: Emergency Medicine | Admitting: Emergency Medicine

## 2018-12-06 DIAGNOSIS — Z79899 Other long term (current) drug therapy: Secondary | ICD-10-CM | POA: Insufficient documentation

## 2018-12-06 DIAGNOSIS — R0981 Nasal congestion: Secondary | ICD-10-CM | POA: Diagnosis not present

## 2018-12-06 DIAGNOSIS — R05 Cough: Secondary | ICD-10-CM | POA: Insufficient documentation

## 2018-12-06 DIAGNOSIS — I10 Essential (primary) hypertension: Secondary | ICD-10-CM | POA: Insufficient documentation

## 2018-12-06 DIAGNOSIS — R0602 Shortness of breath: Secondary | ICD-10-CM | POA: Insufficient documentation

## 2018-12-06 NOTE — ED Triage Notes (Signed)
Pt presents to ED with complaints of SOB and nasal congestion started approx 1 hour ago. Pt denies cough, fever or Covid exposure.

## 2018-12-07 LAB — CBC WITH DIFFERENTIAL/PLATELET
Abs Immature Granulocytes: 0.03 10*3/uL (ref 0.00–0.07)
Basophils Absolute: 0.1 10*3/uL (ref 0.0–0.1)
Basophils Relative: 1 %
Eosinophils Absolute: 0.1 10*3/uL (ref 0.0–0.5)
Eosinophils Relative: 1 %
HCT: 42.6 % (ref 39.0–52.0)
Hemoglobin: 13.2 g/dL (ref 13.0–17.0)
Immature Granulocytes: 0 %
Lymphocytes Relative: 19 %
Lymphs Abs: 1.6 10*3/uL (ref 0.7–4.0)
MCH: 25.7 pg — ABNORMAL LOW (ref 26.0–34.0)
MCHC: 31 g/dL (ref 30.0–36.0)
MCV: 83 fL (ref 80.0–100.0)
Monocytes Absolute: 1.1 10*3/uL — ABNORMAL HIGH (ref 0.1–1.0)
Monocytes Relative: 12 %
Neutro Abs: 5.9 10*3/uL (ref 1.7–7.7)
Neutrophils Relative %: 67 %
Platelets: 201 10*3/uL (ref 150–400)
RBC: 5.13 MIL/uL (ref 4.22–5.81)
RDW: 20.1 % — ABNORMAL HIGH (ref 11.5–15.5)
WBC: 8.8 10*3/uL (ref 4.0–10.5)
nRBC: 0 % (ref 0.0–0.2)

## 2018-12-07 LAB — D-DIMER, QUANTITATIVE: D-Dimer, Quant: 0.29 ug/mL-FEU (ref 0.00–0.50)

## 2018-12-07 LAB — BASIC METABOLIC PANEL
Anion gap: 9 (ref 5–15)
BUN: 10 mg/dL (ref 6–20)
CO2: 24 mmol/L (ref 22–32)
Calcium: 8.8 mg/dL — ABNORMAL LOW (ref 8.9–10.3)
Chloride: 104 mmol/L (ref 98–111)
Creatinine, Ser: 0.82 mg/dL (ref 0.61–1.24)
GFR calc Af Amer: >60 mL/min (ref >60)
GFR calc non Af Amer: >60 mL/min (ref >60)
Glucose, Bld: 104 mg/dL — ABNORMAL HIGH (ref 70–99)
Potassium: 3.6 mmol/L (ref 3.5–5.1)
Sodium: 137 mmol/L (ref 135–145)

## 2018-12-07 LAB — BRAIN NATRIURETIC PEPTIDE: B Natriuretic Peptide: 23 pg/mL (ref 0.0–100.0)

## 2018-12-07 LAB — TROPONIN I (HIGH SENSITIVITY): Troponin I (High Sensitivity): 4 ng/L (ref <18)

## 2018-12-07 MED ORDER — ALBUTEROL SULFATE HFA 108 (90 BASE) MCG/ACT IN AERS
2.0000 | INHALATION_SPRAY | RESPIRATORY_TRACT | Status: DC | PRN
  Administered 2018-12-07: 2 via RESPIRATORY_TRACT
  Filled 2018-12-07: qty 6.7

## 2018-12-07 MED ORDER — PREDNISONE 20 MG PO TABS: 40.0000 mg | ORAL_TABLET | Freq: Every day | ORAL | 0 refills | Status: DC

## 2018-12-07 NOTE — Discharge Instructions (Addendum)
I suspect you are having some allergy related shortness of breath.  Use the inhaler as needed for breathing difficulty.  Take the prednisone for the next 3 days.  Purchase some type of over-the-counter antihistamine and take that daily, schedule follow-up with your primary doctor.

## 2018-12-07 NOTE — ED Notes (Signed)
EKG from triage did not cross over to Epic. EKG repeated and given to Dr Betsey Holiday

## 2018-12-07 NOTE — ED Provider Notes (Signed)
[facility] EMERGENCY DEPARTMENT Provider Note   CSN: [id number] Arrival date & time: 12/06/18  2110     History   Chief Complaint Chief Complaint  Patient presents with   Shortness of Breath    HPI Marco Stevenson is a 63 y.o. male.     Patient presents to the emergency department for evaluation of shortness of breath.  Symptoms began approximately 4 hours ago.  Patient reports that he has had a lot of nasal congestion and increased mucus production over the last month.  Around 4 hours ago he felt like he had a lot of mucus in his airway, tried to cough it up but nothing came up.  He has felt like he has had increased work of breathing, like he cannot quite get enough air in, ever since.  He has not had any associated chest pain.  Has not had any fever.  Patient denies history of chronic lung disease and congestive heart failure.  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  Denies history of DVT and PE.     Past Medical History:  Diagnosis Date   Acid reflux    Hypertension     Patient Active Problem List   Diagnosis Date Noted   Syncopal episodes 05/12/2018   UGI bleed 05/11/2018   Hypertension 05/11/2018   ETOH abuse 05/11/2018   Acid reflux 05/11/2018   Hypomagnesemia 05/11/2018   Acute blood loss anemia 05/11/2018    Past Surgical History:  Procedure Laterality Date   BIOPSY  05/14/2018   Procedure: BIOPSY;  Surgeon: Clarene Essex, MD;  Location: [place];  Service: Endoscopy;;   ESOPHAGOGASTRODUODENOSCOPY (EGD) WITH PROPOFOL N/A 05/12/2018   Procedure: ESOPHAGOGASTRODUODENOSCOPY (EGD) WITH PROPOFOL;  Surgeon: Danie Binder, MD;  [facility];  Service: Endoscopy;  Laterality: N/A;   ESOPHAGOGASTRODUODENOSCOPY (EGD) WITH PROPOFOL N/A 05/14/2018   Procedure: ESOPHAGOGASTRODUODENOSCOPY (EGD) WITH PROPOFOL;  Surgeon: Clarene Essex, MD;  Location: [place];  Service: Endoscopy;  Laterality: N/A;  start with side-viewing scope   ESOPHAGOGASTRODUODENOSCOPY (EGD) WITH PROPOFOL N/A 09/23/2018   Procedure: ESOPHAGOGASTRODUODENOSCOPY (EGD) WITH PROPOFOL;  Surgeon: Arta Silence, MD;  Location: [facility];  Service: Endoscopy;  Laterality: N/A;  endo loop   EUS N/A 09/23/2018   Procedure: UPPER ENDOSCOPIC ULTRASOUND (EUS) RADIAL;  Surgeon: Arta Silence, MD;  Location: [facility];  Service: Endoscopy;  Laterality: N/A;   HERNIA REPAIR     POLYPECTOMY  05/12/2018   Procedure: POLYPECTOMY;  Surgeon: Danie Binder, MD;  [facility];  Service: Endoscopy;;  polyp gastric   POLYPECTOMY  09/23/2018   Procedure: POLYPECTOMY;  Surgeon: Arta Silence, MD;  [facility];  Service: Endoscopy;;   SUBMUCOSAL INJECTION  09/23/2018   Procedure: SUBMUCOSAL INJECTION;  Surgeon: Arta Silence, MD;  [facility];  Service: Endoscopy;;        Home Medications    Prior to Admission medications   Medication Sig Start Date End Date Taking? Authorizing Provider  ALPRAZolam Duanne Moron) 1 MG tablet Take 0.5 mg by mouth at bedtime as needed for sleep.  03/13/18  Yes [provider]  losartan (COZAAR) 100 MG tablet Take 100 mg by mouth daily.  04/08/18  Yes [provider]  Multiple Vitamin (MULTIVITAMIN WITH MINERALS) TABS tablet Take 1 tablet by mouth daily. 05/15/18  Yes Aline August, MD  pantoprazole (PROTONIX) 40 MG tablet Take 1 tablet (40 mg total)  by mouth daily. 05/15/18  Yes Glade LloydAlekh, Kshitiz, MD  Tetrahydrozoline HCl (VISINE OP) Place 1 drop into both eyes daily as needed (dry eyes).   Yes [provider]  thiamine 100 MG tablet Take 1 tablet (100 mg total) by mouth daily. 05/15/18  Yes Glade LloydAlekh, Kshitiz, MD  folic acid (FOLVITE) 1 MG tablet Take 1 tablet (1 mg total) by mouth daily. Patient not taking: Reported on 09/18/2018 05/15/18   Glade LloydAlekh, Kshitiz, MD  ondansetron (ZOFRAN) 4 MG tablet Take 1  tablet (4 mg total) by mouth every 6 (six) hours as needed for nausea. Patient not taking: Reported on 09/18/2018 05/15/18   Glade LloydAlekh, Kshitiz, MD  predniSONE (DELTASONE) 20 MG tablet Take 2 tablets (40 mg total) by mouth daily with breakfast. 12/07/18   Karoline Fleer, Canary Brimhristopher J, MD  sildenafil (VIAGRA) 25 MG tablet Take 25 mg by mouth as needed for erectile dysfunction.     [provider]    Family History Family History  Problem Relation Age of Onset   Hypertension Father    Colon cancer Neg Hx    Colon polyps Neg Hx     Social History Social History   Tobacco Use   Smoking status: Never Smoker   Smokeless tobacco: Never Used  Substance Use Topics   Alcohol use: Yes    Comment: 4-5 shots of tequila for past 6+ months, occasional beer    Drug use: No     Allergies   Patient has no known allergies.   Review of Systems Review of Systems  HENT: Positive for congestion.   Respiratory: Positive for cough and shortness of breath.   All other systems reviewed and are negative.    Physical Exam Updated Vital Signs BP 136/85    Pulse 77    Temp 97.6 F (36.4 C) (Oral)    Resp (!) 36    Ht 5\' 9"  (1.753 m)    Wt 95.3 kg    SpO2 98%    BMI 31.01 kg/m   Physical Exam Vitals signs and nursing note reviewed.  Constitutional:      General: He is not in acute distress.    Appearance: Normal appearance. He is well-developed.  HENT:     Head: Normocephalic and atraumatic.     Right Ear: Hearing normal.     Left Ear: Hearing normal.     Nose: Nose normal.  Eyes:     Conjunctiva/sclera: Conjunctivae normal.     Pupils: Pupils are equal, round, and reactive to light.  Neck:     Musculoskeletal: Normal range of motion and neck supple.  Cardiovascular:     Rate and Rhythm: Regular rhythm.     Heart sounds: S1 normal and S2 normal. No murmur. No friction rub. No gallop.   Pulmonary:     Effort: Pulmonary effort is normal. No respiratory distress.     Breath sounds:  Normal breath sounds.  Chest:     Chest wall: No tenderness.  Abdominal:     General: Bowel sounds are normal.     Palpations: Abdomen is soft.     Tenderness: There is no abdominal tenderness. There is no guarding or rebound. Negative signs include Murphy's sign and McBurney's sign.     Hernia: No hernia is present.  Musculoskeletal: Normal range of motion.  Skin:    General: Skin is warm and dry.     Findings: No rash.  Neurological:     Mental Status: He is alert and oriented to  person, place, and time.     GCS: GCS eye subscore is 4. GCS verbal subscore is 5. GCS motor subscore is 6.     Cranial Nerves: No cranial nerve deficit.     Sensory: No sensory deficit.     Coordination: Coordination normal.  Psychiatric:        Speech: Speech normal.        Behavior: Behavior normal.        Thought Content: Thought content normal.      ED Treatments / Results  Labs (all labs ordered are listed, but only abnormal results are displayed) Labs Reviewed  CBC WITH DIFFERENTIAL/PLATELET - Abnormal; Notable for the following components:      Result Value   MCH 25.7 (*)    RDW 20.1 (*)    Monocytes Absolute 1.1 (*)    All other components within normal limits  BASIC METABOLIC PANEL - Abnormal; Notable for the following components:   Glucose, Bld 104 (*)    Calcium 8.8 (*)    All other components within normal limits  BRAIN NATRIURETIC PEPTIDE  D-DIMER, QUANTITATIVE (NOT AT [facility])  TROPONIN I (HIGH SENSITIVITY)    EKG None  Radiology Dg Chest Portable 1 View  Result Date: 12/06/2018 CLINICAL DATA:  Shortness of breath EXAM: PORTABLE CHEST 1 VIEW COMPARISON:  April 10, 2015 FINDINGS: The heart size and mediastinal contours are within normal limits. Both lungs are clear. The visualized skeletal structures are unremarkable. IMPRESSION: No acute cardiopulmonary process. Electronically Signed   By: Jonna Clark M.D.   On: 12/06/2018 22:37    Procedures Procedures (including  critical care time)  Medications Ordered in ED Medications - No data to display   Initial Impression / Assessment and Plan / ED Course  I have reviewed the triage vital signs and the nursing notes.  Pertinent labs & imaging results that were available during my care of the patient were reviewed by me and considered in my medical decision making (see chart for details).        Patient presents to the emergency department for evaluation of shortness of breath.  Patient reports that he has had nasal congestion and increased mucus production in his throat recently.  Tonight this seemed to worsen and he tried to bring some of the mucus up but was unable to.  For the last few hours he has been feeling like he cannot get a deep breath.  He has not experiencing any associated chest pain.  He does not have any history of chronic lung disease or cardiac disease.  Chest x-ray is clear, no evidence of pneumonia.  He has not hypoxic, tachycardic, tachypneic or dyspneic.  No evidence of congestive heart failure on work-up.  Troponin negative with a normal EKG.  Doubt ACS.  Additionally D-dimer is normal.  He does not have any unilateral leg swelling or signs/symptoms of DVT, doubt PE.  Will provide patient with inhaler and have him take antihistamines, follow-up with PCP.  Final Clinical Impressions(s) / ED Diagnoses   Final diagnoses:  Shortness of breath    ED Discharge Orders         Ordered    predniSONE (DELTASONE) 20 MG tablet  Daily with breakfast     12/07/18 0222           Gilda Crease, MD 12/07/18 [phone]

## 2019-01-12 ENCOUNTER — Other Ambulatory Visit: Payer: Self-pay

## 2019-01-12 DIAGNOSIS — Z20822 Contact with and (suspected) exposure to covid-19: Secondary | ICD-10-CM

## 2019-01-13 LAB — NOVEL CORONAVIRUS, NAA: SARS-CoV-2, NAA: DETECTED — AB

## 2019-01-22 ENCOUNTER — Other Ambulatory Visit: Payer: Self-pay

## 2019-01-22 DIAGNOSIS — Z20822 Contact with and (suspected) exposure to covid-19: Secondary | ICD-10-CM

## 2019-01-24 LAB — NOVEL CORONAVIRUS, NAA: SARS-CoV-2, NAA: NOT DETECTED

## 2019-04-09 ENCOUNTER — Inpatient Hospital Stay (HOSPITAL_COMMUNITY)
Admission: EM | Admit: 2019-04-09 | Discharge: 2019-04-12 | DRG: 392 | Disposition: A | Discharge status: Home / Self Care | Payer: Commercial Managed Care - PPO | Source: Ambulatory Visit | Attending: General Surgery | Admitting: General Surgery

## 2019-04-09 ENCOUNTER — Other Ambulatory Visit: Payer: Self-pay

## 2019-04-09 ENCOUNTER — Encounter (HOSPITAL_COMMUNITY): Payer: Self-pay

## 2019-04-09 ENCOUNTER — Emergency Department (HOSPITAL_COMMUNITY): Payer: Commercial Managed Care - PPO

## 2019-04-09 DIAGNOSIS — K529 Noninfective gastroenteritis and colitis, unspecified: Secondary | ICD-10-CM | POA: Diagnosis not present

## 2019-04-09 DIAGNOSIS — I10 Essential (primary) hypertension: Secondary | ICD-10-CM | POA: Diagnosis present

## 2019-04-09 DIAGNOSIS — K5700 Diverticulitis of small intestine with perforation and abscess without bleeding: Principal | ICD-10-CM | POA: Diagnosis present

## 2019-04-09 DIAGNOSIS — Z8249 Family history of ischemic heart disease and other diseases of the circulatory system: Secondary | ICD-10-CM

## 2019-04-09 DIAGNOSIS — K219 Gastro-esophageal reflux disease without esophagitis: Secondary | ICD-10-CM | POA: Diagnosis present

## 2019-04-09 DIAGNOSIS — Z20822 Contact with and (suspected) exposure to covid-19: Secondary | ICD-10-CM | POA: Diagnosis present

## 2019-04-09 LAB — URINALYSIS, ROUTINE W REFLEX MICROSCOPIC
Bilirubin Urine: NEGATIVE
Glucose, UA: NEGATIVE mg/dL
Hgb urine dipstick: NEGATIVE
Ketones, ur: NEGATIVE mg/dL
Leukocytes,Ua: NEGATIVE
Nitrite: NEGATIVE
Protein, ur: 100 mg/dL — AB
Specific Gravity, Urine: 1.02 (ref 1.005–1.030)
pH: 6 (ref 5.0–8.0)

## 2019-04-09 LAB — CBC
HCT: 46.6 % (ref 39.0–52.0)
Hemoglobin: 16.2 g/dL (ref 13.0–17.0)
MCH: 31.8 pg (ref 26.0–34.0)
MCHC: 34.8 g/dL (ref 30.0–36.0)
MCV: 91.4 fL (ref 80.0–100.0)
Platelets: 205 10*3/uL (ref 150–400)
RBC: 5.1 MIL/uL (ref 4.22–5.81)
RDW: 12.9 % (ref 11.5–15.5)
WBC: 18.5 10*3/uL — ABNORMAL HIGH (ref 4.0–10.5)
nRBC: 0 % (ref 0.0–0.2)

## 2019-04-09 LAB — COMPREHENSIVE METABOLIC PANEL
ALT: 41 U/L (ref 0–44)
AST: 33 U/L (ref 15–41)
Albumin: 3.6 g/dL (ref 3.5–5.0)
Alkaline Phosphatase: 69 U/L (ref 38–126)
Anion gap: 10 (ref 5–15)
BUN: 9 mg/dL (ref 8–23)
CO2: 24 mmol/L (ref 22–32)
Calcium: 8.9 mg/dL (ref 8.9–10.3)
Chloride: 98 mmol/L (ref 98–111)
Creatinine, Ser: 1.07 mg/dL (ref 0.61–1.24)
GFR calc Af Amer: >60 mL/min (ref >60)
GFR calc non Af Amer: >60 mL/min (ref >60)
Glucose, Bld: 105 mg/dL — ABNORMAL HIGH (ref 70–99)
Potassium: 3.7 mmol/L (ref 3.5–5.1)
Sodium: 132 mmol/L — ABNORMAL LOW (ref 135–145)
Total Bilirubin: 1.5 mg/dL — ABNORMAL HIGH (ref 0.3–1.2)
Total Protein: 7.2 g/dL (ref 6.5–8.1)

## 2019-04-09 LAB — LIPASE, BLOOD: Lipase: 25 U/L (ref 11–51)

## 2019-04-09 MED ORDER — ONDANSETRON HCL 4 MG/2ML IJ SOLN
4.0000 mg | Freq: Once | INTRAMUSCULAR | Status: AC
  Administered 2019-04-09: 4 mg via INTRAVENOUS
  Filled 2019-04-09: qty 2

## 2019-04-09 MED ORDER — SODIUM CHLORIDE 0.9 % IV BOLUS
1000.0000 mL | Freq: Once | INTRAVENOUS | Status: AC
  Administered 2019-04-09: 23:00:00 1000 mL via INTRAVENOUS

## 2019-04-09 MED ORDER — IOHEXOL 300 MG/ML  SOLN
100.0000 mL | Freq: Once | INTRAMUSCULAR | Status: AC | PRN
  Administered 2019-04-09: 23:00:00 100 mL via INTRAVENOUS

## 2019-04-09 MED ORDER — PIPERACILLIN-TAZOBACTAM 3.375 G IVPB 30 MIN
3.3750 g | Freq: Once | INTRAVENOUS | Status: AC
  Administered 2019-04-09: 3.375 g via INTRAVENOUS
  Filled 2019-04-09: qty 50

## 2019-04-09 NOTE — ED Triage Notes (Signed)
Pt reports abd pain for 3 days, labs done at PCP and was told his WBC was 1700 and was told to come here ASAP. Pt denies n/v/d. Pt ambulatory, nad noted.

## 2019-04-09 NOTE — ED Provider Notes (Signed)
[facility] EMERGENCY DEPARTMENT Provider Note   CSN: [id number] Arrival date & time: 04/09/19  1802     History Chief Complaint  Patient presents with  . Abdominal Pain    Marco Stevenson is a 64 y.o. male.  Patient presents with a 3-day history of lower abdominal pain that is diffuse but worse in his right lower quadrant.  He states he was seen at urgent care and had blood work that showed a white blood cell count of 17,000 and was referred to the ED.  He states he is not had any appetite for the past 3 days with nausea with no vomiting.  No fever.  No pain with urination or blood in the urine.  No black or bloody stools.  Does have a history of previous GI polyp in his duodenum and stomach.  He states he was initially told this was cancer but on reevaluation it was not.  He is on Protonix twice daily.  He denies any dizziness or lightheadedness.  No testicular pain.  No chest pain or shortness of breath.  Still has appendix and gallbladder.  The history is provided by the patient.  Abdominal Pain Associated symptoms: nausea   Associated symptoms: no chest pain, no cough, no dysuria, no fever, no hematuria, no shortness of breath and no vomiting        Past Medical History:  Diagnosis Date  . Acid reflux   . Hypertension     Patient Active Problem List   Diagnosis Date Noted  . Syncopal episodes 05/12/2018  . UGI bleed 05/11/2018  . Hypertension 05/11/2018  . ETOH abuse 05/11/2018  . Acid reflux 05/11/2018  . Hypomagnesemia 05/11/2018  . Acute blood loss anemia 05/11/2018    Past Surgical History:  Procedure Laterality Date  . BIOPSY  05/14/2018   Procedure: BIOPSY;  Surgeon: Vida Rigger, MD;  Location: [facility] ENDOSCOPY;  Service: Endoscopy;;  . ESOPHAGOGASTRODUODENOSCOPY (EGD) WITH PROPOFOL N/A 05/12/2018   Procedure: ESOPHAGOGASTRODUODENOSCOPY (EGD) WITH PROPOFOL;  Surgeon: West Bali, MD;  [facility];  Service: Endoscopy;   Laterality: N/A;  . ESOPHAGOGASTRODUODENOSCOPY (EGD) WITH PROPOFOL N/A 05/14/2018   Procedure: ESOPHAGOGASTRODUODENOSCOPY (EGD) WITH PROPOFOL;  Surgeon: Vida Rigger, MD;  Location: [facility] ENDOSCOPY;  Service: Endoscopy;  Laterality: N/A;  start with side-viewing scope  . ESOPHAGOGASTRODUODENOSCOPY (EGD) WITH PROPOFOL N/A 09/23/2018   Procedure: ESOPHAGOGASTRODUODENOSCOPY (EGD) WITH PROPOFOL;  Surgeon: Willis Modena, MD;  [facility];  Service: Endoscopy;  Laterality: N/A;  endo loop  . EUS N/A 09/23/2018   Procedure: UPPER ENDOSCOPIC ULTRASOUND (EUS) RADIAL;  Surgeon: Willis Modena, MD;  [facility]: [facility];  Service: Endoscopy;  Laterality: N/A;  . HERNIA REPAIR    . POLYPECTOMY  05/12/2018   Procedure: POLYPECTOMY;  Surgeon: West Bali, MD;  [facility];  Service: Endoscopy;;  polyp gastric  . POLYPECTOMY  09/23/2018   Procedure: POLYPECTOMY;  Surgeon: Willis Modena, MD;  [facility];  Service: Endoscopy;;  . SUBMUCOSAL INJECTION  09/23/2018   Procedure: SUBMUCOSAL INJECTION;  Surgeon: Willis Modena, MD;  [facility];  Service: Endoscopy;;       Family History  Problem Relation Age of Onset  . Hypertension Father   . Colon cancer Neg Hx   . Colon polyps Neg Hx     Social History   Tobacco Use  . Smoking status: Never Smoker  . Smokeless tobacco: Never Used  Substance Use Topics  . Alcohol use: Yes  Comment: 4-5 shots of tequila for past 6+ months, occasional beer   . Drug use: No    Home Medications Prior to Admission medications   Medication Sig Start Date End Date Taking? Authorizing Provider  ALPRAZolam Prudy Feeler) 1 MG tablet Take 0.5 mg by mouth at bedtime as needed for sleep.  03/13/18   [provider]  folic acid (FOLVITE) 1 MG tablet Take 1 tablet (1 mg total) by mouth daily. Patient not taking: Reported on 09/18/2018 05/15/18   Glade Lloyd, MD  losartan (COZAAR) 100 MG tablet Take 100 mg by mouth daily.   04/08/18   [provider]  Multiple Vitamin (MULTIVITAMIN WITH MINERALS) TABS tablet Take 1 tablet by mouth daily. 05/15/18   Glade Lloyd, MD  ondansetron (ZOFRAN) 4 MG tablet Take 1 tablet (4 mg total) by mouth every 6 (six) hours as needed for nausea. Patient not taking: Reported on 09/18/2018 05/15/18   Glade Lloyd, MD  pantoprazole (PROTONIX) 40 MG tablet Take 1 tablet (40 mg total) by mouth daily. 05/15/18   Glade Lloyd, MD  predniSONE (DELTASONE) 20 MG tablet Take 2 tablets (40 mg total) by mouth daily with breakfast. 12/07/18   Pollina, Canary Brim, MD  sildenafil (VIAGRA) 25 MG tablet Take 25 mg by mouth as needed for erectile dysfunction.     [provider]  Tetrahydrozoline HCl (VISINE OP) Place 1 drop into both eyes daily as needed (dry eyes).    [provider]  thiamine 100 MG tablet Take 1 tablet (100 mg total) by mouth daily. 05/15/18   Glade Lloyd, MD    Allergies    Patient has no known allergies.  Review of Systems   Review of Systems  Constitutional: Positive for activity change and appetite change. Negative for fever.  HENT: Negative for congestion and rhinorrhea.   Eyes: Negative for visual disturbance.  Respiratory: Negative for cough, chest tightness and shortness of breath.   Cardiovascular: Negative for chest pain.  Gastrointestinal: Positive for abdominal pain and nausea. Negative for vomiting.  Genitourinary: Negative for dysuria and hematuria.  Musculoskeletal: Negative for arthralgias, back pain and myalgias.  Skin: Negative for rash.  Neurological: Negative for dizziness, weakness and headaches.   all other systems are negative except as noted in the HPI and PMH.    Physical Exam Updated Vital Signs BP 133/88   Pulse (!) 102   Temp 97.9 F (36.6 C) (Oral)   Resp (!) 30   SpO2 100%   Physical Exam Vitals and nursing note reviewed.  Constitutional:      General: He is not in acute distress.    Appearance: He is  well-developed.  HENT:     Head: Normocephalic and atraumatic.     Mouth/Throat:     Pharynx: No oropharyngeal exudate.  Eyes:     Conjunctiva/sclera: Conjunctivae normal.     Pupils: Pupils are equal, round, and reactive to light.  Neck:     Comments: No meningismus. Cardiovascular:     Rate and Rhythm: Normal rate and regular rhythm.     Heart sounds: Normal heart sounds. No murmur.  Pulmonary:     Effort: Pulmonary effort is normal. No respiratory distress.     Breath sounds: Normal breath sounds.  Abdominal:     Palpations: Abdomen is soft.     Tenderness: There is abdominal tenderness. There is no guarding or rebound.     Comments: TTP lower abdomen.  Guarding in RLQ.  No testicular pain.  Genitourinary:  Comments: No testicular pain. Musculoskeletal:        General: No tenderness. Normal range of motion.     Cervical back: Normal range of motion and neck supple.     Comments: No CVAT  Skin:    General: Skin is warm.  Neurological:     Mental Status: He is alert and oriented to person, place, and time.     Cranial Nerves: No cranial nerve deficit.     Motor: No abnormal muscle tone.     Coordination: Coordination normal.     Comments: No ataxia on finger to nose bilaterally. No pronator drift. 5/5 strength throughout. CN 2-12 intact.Equal grip strength. Sensation intact.   Psychiatric:        Behavior: Behavior normal.     ED Results / Procedures / Treatments   Labs (all labs ordered are listed, but only abnormal results are displayed) Labs Reviewed  COMPREHENSIVE METABOLIC PANEL - Abnormal; Notable for the following components:      Result Value   Sodium 132 (*)    Glucose, Bld 105 (*)    Total Bilirubin 1.5 (*)    All other components within normal limits  CBC - Abnormal; Notable for the following components:   WBC 18.5 (*)    All other components within normal limits  URINALYSIS, ROUTINE W REFLEX MICROSCOPIC - Abnormal; Notable for the following  components:   APPearance HAZY (*)    Protein, ur 100 (*)    Bacteria, UA RARE (*)    All other components within normal limits  LIPASE, BLOOD    EKG None  Radiology CT ABDOMEN PELVIS W CONTRAST  Addendum Date: 04/09/2019   ADDENDUM REPORT: 04/09/2019 23:37 ADDENDUM: Study discussed by telephone with Dr. [name] on 04/09/2019 at 2332 hours. Electronically Signed   By: Odessa Fleming M.D.   On: 04/09/2019 23:37   Result Date: 04/09/2019 CLINICAL DATA:  64 year old male with right lower quadrant abdominal pain. EXAM: CT ABDOMEN AND PELVIS WITH CONTRAST TECHNIQUE: Multidetector CT imaging of the abdomen and pelvis was performed using the standard protocol following bolus administration of intravenous contrast. CONTRAST:  OMNIPAQUE IOHEXOL 300 MG/ML  SOLN COMPARISON:  CT Abdomen and Pelvis 05/13/2018. FINDINGS: Lower chest: Stable and negative. Hepatobiliary: Hepatic steatosis. Negative gallbladder. No bile duct enlargement. Pancreas: Atrophied but otherwise negative. Spleen: Negative. Adrenals/Urinary Tract: Normal adrenal glands. Bilateral renal enhancement and contrast excretion is symmetric and within normal limits. Occasional small benign renal cysts. The right ureter passes through the area of inflammation in the right lower quadrant but seems to remain normal. Unremarkable urinary bladder. Stomach/Bowel: Negative distal sigmoid and rectum. Diverticulosis throughout the more redundant mid and proximal sigmoid. Diverticulosis continues into the descending colon, but no active inflammation is identified. Negative transverse colon and hepatic flexure. Negative ascending colon. There is a 9-10 cm area of moderate inflammation from the tip of the cecum through about 10 or 12 cm of the distal ileum. There is diverticulosis of the terminal ileum. And there is a small collection of extraluminal gas in the mesentery of the TI on coronal image 52. Nearby the appendix is identified and fluid-filled  (coronal image 54). And the walls of the appendix may be mildly hyperenhancing, but it does not seem to be the epicenter of inflammation. The appendix is 6-8 mm diameter terminating on coronal image 60. By contrast the distal small bowel is thickened and inflamed with abundant mesenteric stranding. No free fluid or fluid collection. Upstream of the abnormal  loops the more proximal ileum is nondilated and within normal limits. No free intraperitoneal air. There is a large chronic lobulated 6 cm diverticulum of the duodenum on series 3, image 45 with no active inflammation. The stomach appears negative. No free fluid. Vascular/Lymphatic: Major arterial structures are patent with only minimal iliac artery atherosclerosis identified. Portal venous system appears to be patent. Reproductive: Negative. Other: No pelvic free fluid. Musculoskeletal: Stable mild anterior wedge compression of T11. Chronic disc and endplate degeneration at the lumbosacral junction. No acute osseous abnormality identified. IMPRESSION: 1. Unusual appearance of Severe Diverticulitis of the distal SMALL BOWEL. The distal 12 cm of terminal ileum are severely inflamed and there is small Contained Perforation into the mesentery (coronal image 52). But no abscess or drainable fluid collection. 2. Both the cecum and the appendix appear to be secondarily inflamed. 3. No other acute findings. Distal large bowel diverticulosis. Duodenal diverticulum. Hepatic steatosis. Electronically Signed: By: Genevie Ann M.D. On: 04/09/2019 23:27    Procedures Procedures (including critical care time)  Medications Ordered in ED Medications  sodium chloride 0.9 % bolus 1,000 mL (has no administration in time range)  ondansetron (ZOFRAN) injection 4 mg (has no administration in time range)  sodium chloride 0.9 % bolus 1,000 mL (has no administration in time range)    ED Course  I have reviewed the triage vital signs and the nursing notes.  Pertinent labs &  imaging results that were available during my care of the patient were reviewed by me and considered in my medical decision making (see chart for details).    MDM Rules/Calculators/A&P                       Patient with 3 days of lower abdominal pain and anorexia with concern for appendicitis.  Leukocytosis on labs.  Denies need for any pain or nausea medication.  CT scan shows normal appendix but some secondary inflammation.  Primary problem appears to be diverticulitis of distal small bowel and terminal ileum with some areas of contained perforation.  Discussed with Dr. Nevada Crane of radiology.  Results show perforated diverticulitis with secondary inflammation of appendix.  Patient started on IV Zosyn.  Discussed with Dr. Donne Hazel of surgery who will evaluate for admission.  Patient updated.   Final Clinical Impression(s) / ED Diagnoses Final diagnoses:  Perforated diverticulum of small intestine    Rx / DC Orders ED Discharge Orders    None       Brinson Tozzi, Annie Main, MD 04/10/19 0000

## 2019-04-10 ENCOUNTER — Encounter (HOSPITAL_COMMUNITY): Payer: Self-pay

## 2019-04-10 DIAGNOSIS — K5700 Diverticulitis of small intestine with perforation and abscess without bleeding: Secondary | ICD-10-CM | POA: Diagnosis present

## 2019-04-10 DIAGNOSIS — K529 Noninfective gastroenteritis and colitis, unspecified: Secondary | ICD-10-CM | POA: Diagnosis present

## 2019-04-10 DIAGNOSIS — K219 Gastro-esophageal reflux disease without esophagitis: Secondary | ICD-10-CM | POA: Diagnosis present

## 2019-04-10 DIAGNOSIS — Z8249 Family history of ischemic heart disease and other diseases of the circulatory system: Secondary | ICD-10-CM | POA: Diagnosis not present

## 2019-04-10 DIAGNOSIS — Z20822 Contact with and (suspected) exposure to covid-19: Secondary | ICD-10-CM | POA: Diagnosis present

## 2019-04-10 DIAGNOSIS — I10 Essential (primary) hypertension: Secondary | ICD-10-CM | POA: Diagnosis present

## 2019-04-10 LAB — BASIC METABOLIC PANEL
Anion gap: 13 (ref 5–15)
BUN: 7 mg/dL — ABNORMAL LOW (ref 8–23)
CO2: 18 mmol/L — ABNORMAL LOW (ref 22–32)
Calcium: 8 mg/dL — ABNORMAL LOW (ref 8.9–10.3)
Chloride: 104 mmol/L (ref 98–111)
Creatinine, Ser: 1.07 mg/dL (ref 0.61–1.24)
GFR calc Af Amer: >60 mL/min (ref >60)
GFR calc non Af Amer: >60 mL/min (ref >60)
Glucose, Bld: 86 mg/dL (ref 70–99)
Potassium: 3.6 mmol/L (ref 3.5–5.1)
Sodium: 135 mmol/L (ref 135–145)

## 2019-04-10 LAB — RESPIRATORY PANEL BY RT PCR (FLU A&B, COVID)
Influenza A by PCR: NEGATIVE
Influenza B by PCR: NEGATIVE
SARS Coronavirus 2 by RT PCR: NEGATIVE

## 2019-04-10 LAB — CBC
HCT: 41.9 % (ref 39.0–52.0)
Hemoglobin: 14.2 g/dL (ref 13.0–17.0)
MCH: 31.9 pg (ref 26.0–34.0)
MCHC: 33.9 g/dL (ref 30.0–36.0)
MCV: 94.2 fL (ref 80.0–100.0)
Platelets: 166 10*3/uL (ref 150–400)
RBC: 4.45 MIL/uL (ref 4.22–5.81)
RDW: 13 % (ref 11.5–15.5)
WBC: 16.7 10*3/uL — ABNORMAL HIGH (ref 4.0–10.5)
nRBC: 0 % (ref 0.0–0.2)

## 2019-04-10 MED ORDER — ACETAMINOPHEN 325 MG PO TABS: 650.0000 mg | ORAL_TABLET | Freq: Four times a day (QID) | ORAL | Status: DC | PRN

## 2019-04-10 MED ORDER — ONDANSETRON 4 MG PO TBDP: 4.0000 mg | ORAL_TABLET | Freq: Four times a day (QID) | ORAL | Status: DC | PRN

## 2019-04-10 MED ORDER — ENOXAPARIN SODIUM 40 MG/0.4ML ~~LOC~~ SOLN
40.0000 mg | SUBCUTANEOUS | Status: DC
  Administered 2019-04-10 – 2019-04-12 (×3): 40 mg via SUBCUTANEOUS
  Filled 2019-04-10 (×3): qty 0.4

## 2019-04-10 MED ORDER — PIPERACILLIN-TAZOBACTAM 3.375 G IVPB
3.3750 g | Freq: Three times a day (TID) | INTRAVENOUS | Status: DC
  Administered 2019-04-10 – 2019-04-12 (×7): 3.375 g via INTRAVENOUS
  Filled 2019-04-10 (×7): qty 50

## 2019-04-10 MED ORDER — PANTOPRAZOLE SODIUM 40 MG PO TBEC
40.0000 mg | DELAYED_RELEASE_TABLET | Freq: Every day | ORAL | Status: DC
  Administered 2019-04-10 – 2019-04-12 (×3): 40 mg via ORAL
  Filled 2019-04-10 (×3): qty 1

## 2019-04-10 MED ORDER — SODIUM CHLORIDE 0.9 % IV SOLN: INTRAVENOUS | Status: DC

## 2019-04-10 MED ORDER — LOSARTAN POTASSIUM 50 MG PO TABS
100.0000 mg | ORAL_TABLET | Freq: Every day | ORAL | Status: DC
  Administered 2019-04-11 – 2019-04-12 (×2): 100 mg via ORAL
  Filled 2019-04-10 (×2): qty 2

## 2019-04-10 MED ORDER — WHITE PETROLATUM EX OINT
TOPICAL_OINTMENT | CUTANEOUS | Status: AC
  Administered 2019-04-10: 0.2
  Filled 2019-04-10: qty 28.35

## 2019-04-10 MED ORDER — METOPROLOL TARTRATE 5 MG/5ML IV SOLN: 5.0000 mg | Freq: Four times a day (QID) | INTRAVENOUS | Status: DC | PRN

## 2019-04-10 MED ORDER — ACETAMINOPHEN 650 MG RE SUPP: 650.0000 mg | Freq: Four times a day (QID) | RECTAL | Status: DC | PRN

## 2019-04-10 MED ORDER — ALPRAZOLAM 0.5 MG PO TABS: 0.5000 mg | ORAL_TABLET | Freq: Every evening | ORAL | Status: DC | PRN

## 2019-04-10 MED ORDER — HYDROMORPHONE HCL 1 MG/ML IJ SOLN: 1.0000 mg | INTRAMUSCULAR | Status: DC | PRN

## 2019-04-10 MED ORDER — SIMETHICONE 80 MG PO CHEW: 40.0000 mg | CHEWABLE_TABLET | Freq: Four times a day (QID) | ORAL | Status: DC | PRN

## 2019-04-10 MED ORDER — ONDANSETRON HCL 4 MG/2ML IJ SOLN: 4.0000 mg | Freq: Four times a day (QID) | INTRAMUSCULAR | Status: DC | PRN

## 2019-04-10 NOTE — Consult Note (Signed)
Referring Provider:  [facility] Primary Care Physician:  Gareth Morgan, MD Primary Gastroenterologist:   Dr. Ewing Schlein  Reason for Consultation: Abnormal CT scan, concerning for inflammatory bowel disease  HPI: Marco Stevenson is a 64 y.o. male presented to the emergency room for further evaluation of abdominal pain.  Patient was having intermittent lower abdominal discomfort for last 1 week.  Around 3 days ago he started noticing worsening lower abdominal pain which he described as sharp and stabbing pain.  Denies any associated nausea or vomiting.  Denies any diarrhea or constipation.  Denies any blood in the stool or black stool.  CT scan on admission showed unusual finding of terminal ileal diverticulitis.  Denies any recurrent GI symptoms.  Family history of aunts with Crohn's disease.  No previous colonoscopy.  EGD in March 2020 showed large pedunculated polyp in the duodenal bulb and duodenal diverticulum.  EUS by Dr. Dulce Sellar in August 2020 showed that this lesion was confined to the mucosa and there was no large blood vessel in the stomach.  He had polypectomy by Dr. Ewing Schlein on September 23, 2018.  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   Past Medical History:  Diagnosis Date  . Acid reflux   . Hypertension     Past Surgical History:  Procedure Laterality Date  . BIOPSY  05/14/2018   Procedure: BIOPSY;  Surgeon: Vida Rigger, MD;  Location: [facility] ENDOSCOPY;  Service: Endoscopy;;  . ESOPHAGOGASTRODUODENOSCOPY (EGD) WITH PROPOFOL N/A 05/12/2018   Procedure: ESOPHAGOGASTRODUODENOSCOPY (EGD) WITH PROPOFOL;  Surgeon: West Bali, MD;  Location: [facility] ENDO SUITE;  Service: Endoscopy;  Laterality: N/A;  . ESOPHAGOGASTRODUODENOSCOPY (EGD) WITH PROPOFOL N/A 05/14/2018   Procedure: ESOPHAGOGASTRODUODENOSCOPY (EGD) WITH PROPOFOL;  Surgeon: Vida Rigger, MD;  Location: [facility] ENDOSCOPY;  Service: Endoscopy;  Laterality: N/A;  start with side-viewing scope  . ESOPHAGOGASTRODUODENOSCOPY (EGD) WITH  PROPOFOL N/A 09/23/2018   Procedure: ESOPHAGOGASTRODUODENOSCOPY (EGD) WITH PROPOFOL;  Surgeon: Willis Modena, MD;  Location: [facility] ENDOSCOPY;  Service: Endoscopy;  Laterality: N/A;  endo loop  . EUS N/A 09/23/2018   Procedure: UPPER ENDOSCOPIC ULTRASOUND (EUS) RADIAL;  Surgeon: Willis Modena, MD;  Location: [facility] ENDOSCOPY;  Service: Endoscopy;  Laterality: N/A;  . HERNIA REPAIR    . POLYPECTOMY  05/12/2018   Procedure: POLYPECTOMY;  Surgeon: West Bali, MD;  Location: [facility] ENDO SUITE;  Service: Endoscopy;;  polyp gastric  . POLYPECTOMY  09/23/2018   Procedure: POLYPECTOMY;  Surgeon: Willis Modena, MD;  Location: [facility] ENDOSCOPY;  Service: Endoscopy;;  . SUBMUCOSAL INJECTION  09/23/2018   Procedure: SUBMUCOSAL INJECTION;  Surgeon: Willis Modena, MD;  Location: [facility] ENDOSCOPY;  Service: Endoscopy;;    Prior to Admission medications   Medication Sig Start Date End Date Taking? Authorizing Provider  ALPRAZolam Prudy Feeler) 1 MG tablet Take 0.5 mg by mouth at bedtime as needed for sleep.  03/13/18  Yes [provider]  losartan (COZAAR) 100 MG tablet Take 100 mg by mouth daily.  04/08/18  Yes [provider]  Multiple Vitamin (MULTIVITAMIN WITH MINERALS) TABS tablet Take 1 tablet by mouth daily. 05/15/18  Yes Glade Lloyd, MD  pantoprazole (PROTONIX) 40 MG tablet Take 1 tablet (40 mg total) by mouth daily. 05/15/18  Yes Glade Lloyd, MD  sildenafil (VIAGRA) 25 MG tablet Take 25 mg by mouth as needed for erectile dysfunction.    Yes [provider]  Tetrahydrozoline HCl (VISINE OP) Place 1 drop into both eyes daily as needed (dry eyes).   Yes [provider]  folic acid (  FOLVITE) 1 MG tablet Take 1 tablet (1 mg total) by mouth daily. Patient not taking: Reported on 09/18/2018 05/15/18   Glade Lloyd, MD  ondansetron (ZOFRAN) 4 MG tablet Take 1 tablet (4 mg total) by mouth every 6 (six) hours as needed for nausea. Patient not taking: Reported on 09/18/2018 05/15/18   Glade Lloyd, MD  predniSONE (DELTASONE) 20 MG tablet Take 2 tablets (40 mg total) by mouth daily with breakfast. Patient not taking: Reported on 04/10/2019 12/07/18   Gilda Crease, MD  thiamine 100 MG tablet Take 1 tablet (100 mg total) by mouth daily. Patient not taking: Reported on 04/10/2019 05/15/18   Glade Lloyd, MD    Scheduled Meds: . enoxaparin (LOVENOX) injection  40 mg Subcutaneous Q24H  . [START ON 04/11/2019] losartan  100 mg Oral Daily  . pantoprazole  40 mg Oral Daily   Continuous Infusions: . sodium chloride 100 mL/hr at 04/10/19 0723  . piperacillin-tazobactam (ZOSYN)  IV 3.375 g (04/10/19 0845)   PRN Meds:.acetaminophen **OR** acetaminophen, ALPRAZolam, HYDROmorphone (DILAUDID) injection, metoprolol tartrate, ondansetron **OR** ondansetron (ZOFRAN) IV, simethicone  Allergies as of 04/09/2019  . (No Known Allergies)    Family History  Problem Relation Age of Onset  . Hypertension Father   . Colon cancer Neg Hx   . Colon polyps Neg Hx     Social History   Socioeconomic History  . Marital status: Divorced    Spouse name: Not on file  . Number of children: Not on file  . Years of education: Not on file  . Highest education level: Not on file  Occupational History  . Not on file  Tobacco Use  . Smoking status: Never Smoker  . Smokeless tobacco: Never Used  Substance and Sexual Activity  . Alcohol use: Yes    Comment: 4-5 shots of tequila for past 6+ months, occasional beer   . Drug use: No  . Sexual activity: Yes  Other Topics Concern  . Not on file  Social History Narrative  . Not on file   Social Determinants of Health   Financial Resource Strain:   . Difficulty of Paying Living Expenses: Not on file  Food Insecurity:   . Worried About Programme researcher, broadcasting/film/video in the Last Year: Not on file  . Ran Out of Food in the Last Year: Not on file  Transportation Needs:   . Lack of Transportation (Medical): Not on file  . Lack of Transportation  (Non-Medical): Not on file  Physical Activity:   . Days of Exercise per Week: Not on file  . Minutes of Exercise per Session: Not on file  Stress:   . Feeling of Stress : Not on file  Social Connections:   . Frequency of Communication with Friends and Family: Not on file  . Frequency of Social Gatherings with Friends and Family: Not on file  . Attends Religious Services: Not on file  . Active Member of Clubs or Organizations: Not on file  . Attends Banker Meetings: Not on file  . Marital Status: Not on file  Intimate Partner Violence:   . Fear of Current or Ex-Partner: Not on file  . Emotionally Abused: Not on file  . Physically Abused: Not on file  . Sexually Abused: Not on file    Review of Systems: Review of Systems  Constitutional: Negative for chills and fever.  HENT: Negative for hearing loss.   Eyes: Negative for blurred vision and double vision.  Respiratory: Negative  for cough and hemoptysis.   Cardiovascular: Negative for chest pain and palpitations.  Gastrointestinal: Positive for abdominal pain. Negative for constipation, diarrhea, heartburn, nausea and vomiting.  Genitourinary: Negative for dysuria and urgency.  Musculoskeletal: Negative for myalgias and neck pain.  Skin: Negative for itching and rash.  Neurological: Negative for seizures and loss of consciousness.  Endo/Heme/Allergies: Does not bruise/bleed easily.  Psychiatric/Behavioral: Negative for substance abuse. The patient is not nervous/anxious.     Physical Exam: Vital signs: Vitals:   04/10/19 0711 04/10/19 0821  BP: 132/86 111/85  Pulse: 85 86  Resp: 16 20  Temp:    SpO2: 96% 96%   Last BM Date: 04/09/19 Physical Exam  Constitutional: He is oriented to person, place, and time. He appears well-developed and well-nourished. No distress.  HENT:  Head: Normocephalic and atraumatic.  Eyes: EOM are normal. No scleral icterus.  Cardiovascular: Normal rate, regular rhythm and normal  heart sounds.  Pulmonary/Chest: Effort normal. No respiratory distress.  Abdominal: Soft. Bowel sounds are normal. He exhibits no distension. There is no abdominal tenderness. There is no rebound and no guarding.  Musculoskeletal:        General: No edema. Normal range of motion.     Cervical back: Normal range of motion and neck supple.  Neurological: He is alert and oriented to person, place, and time.  Skin: Skin is warm. No erythema.  Psychiatric: He has a normal mood and affect. Judgment and thought content normal.  Vitals reviewed.   GI:  Lab Results: Recent Labs    04/09/19 1831 04/10/19 0650  WBC 18.5* 16.7*  HGB 16.2 14.2  HCT 46.6 41.9  PLT 205 166   BMET Recent Labs    04/09/19 1831 04/10/19 0650  NA 132* 135  K 3.7 3.6  CL 98 104  CO2 24 18*  GLUCOSE 105* 86  BUN 9 7*  CREATININE 1.07 1.07  CALCIUM 8.9 8.0*   LFT Recent Labs    04/09/19 1831  PROT 7.2  ALBUMIN 3.6  AST 33  ALT 41  ALKPHOS 69  BILITOT 1.5*   PT/INR No results for input(s): LABPROT, INR in the last 72 hours.   Studies/Results: CT ABDOMEN PELVIS W CONTRAST  Addendum Date: 04/09/2019   ADDENDUM REPORT: 04/09/2019 23:37 ADDENDUM: Study discussed by telephone with Dr. Ezequiel Essex on 04/09/2019 at 2332 hours. Electronically Signed   By: Genevie Ann M.D.   On: 04/09/2019 23:37   Result Date: 04/09/2019 CLINICAL DATA:  64 year old male with right lower quadrant abdominal pain. EXAM: CT ABDOMEN AND PELVIS WITH CONTRAST TECHNIQUE: Multidetector CT imaging of the abdomen and pelvis was performed using the standard protocol following bolus administration of intravenous contrast. CONTRAST:  117mL OMNIPAQUE IOHEXOL 300 MG/ML  SOLN COMPARISON:  CT Abdomen and Pelvis 05/13/2018. FINDINGS: Lower chest: Stable and negative. Hepatobiliary: Hepatic steatosis. Negative gallbladder. No bile duct enlargement. Pancreas: Atrophied but otherwise negative. Spleen: Negative. Adrenals/Urinary Tract: Normal  adrenal glands. Bilateral renal enhancement and contrast excretion is symmetric and within normal limits. Occasional small benign renal cysts. The right ureter passes through the area of inflammation in the right lower quadrant but seems to remain normal. Unremarkable urinary bladder. Stomach/Bowel: Negative distal sigmoid and rectum. Diverticulosis throughout the more redundant mid and proximal sigmoid. Diverticulosis continues into the descending colon, but no active inflammation is identified. Negative transverse colon and hepatic flexure. Negative ascending colon. There is a 9-10 cm area of moderate inflammation from the tip of the cecum through about 10  or 12 cm of the distal ileum. There is diverticulosis of the terminal ileum. And there is a small collection of extraluminal gas in the mesentery of the TI on coronal image 52. Nearby the appendix is identified and fluid-filled (coronal image 54). And the walls of the appendix may be mildly hyperenhancing, but it does not seem to be the epicenter of inflammation. The appendix is 6-8 mm diameter terminating on coronal image 60. By contrast the distal small bowel is thickened and inflamed with abundant mesenteric stranding. No free fluid or fluid collection. Upstream of the abnormal loops the more proximal ileum is nondilated and within normal limits. No free intraperitoneal air. There is a large chronic lobulated 6 cm diverticulum of the duodenum on series 3, image 45 with no active inflammation. The stomach appears negative. No free fluid. Vascular/Lymphatic: Major arterial structures are patent with only minimal iliac artery atherosclerosis identified. Portal venous system appears to be patent. Reproductive: Negative. Other: No pelvic free fluid. Musculoskeletal: Stable mild anterior wedge compression of T11. Chronic disc and endplate degeneration at the lumbosacral junction. No acute osseous abnormality identified. IMPRESSION: 1. Unusual appearance of Severe  Diverticulitis of the distal SMALL BOWEL. The distal 12 cm of terminal ileum are severely inflamed and there is small Contained Perforation into the mesentery (coronal image 52). But no abscess or drainable fluid collection. 2. Both the cecum and the appendix appear to be secondarily inflamed. 3. No other acute findings. Distal large bowel diverticulosis. Duodenal diverticulum. Hepatic steatosis. Electronically Signed: By: Odessa Fleming M.D. On: 04/09/2019 23:27    Impression/Plan: -Lower abdominal pain with CT scan showing unusual finding of severe diverticulitis of the distal small bowel. -Leukocytosis.  Probably from underlying diverticulitis.  Normal LFTs.  Normal lipase -History of large pedunculated polyp in the duodenal bulb.  Pathology revealed juvenile type polyp  Recommendations ----------------------- -Continue IV antibiotics for now. -Start full  liquid diet and advance as tolerated -Recommend to hold off on colonoscopy now given acute diverticulitis which will increase risk of procedure related complication particularly perforation. -Recommend CT enterography in 4 weeks after discharge prior to doing colonoscopy -GI will follow    LOS: 0 days   Kathi Der  MD, FACP 04/10/2019, 10:31 AM  Contact #  [phone]

## 2019-04-10 NOTE — Progress Notes (Signed)
Pharmacy Antibiotic Note  Marco Stevenson is a 64 y.o. male admitted on 04/09/2019 with atypical small bowel diverticulitis versus enteritis of ileum continuing into the cecum.  Pharmacy has been consulted for Zosyn dosing.  Plan: Zosyn 3.375g IV q8h (4 hour infusion).  Temp (24hrs), Avg:98.2 F (36.8 C), Min:97.9 F (36.6 C), Max:98.5 F (36.9 C)  Recent Labs  Lab 04/09/19 1831  WBC 18.5*  CREATININE 1.07      No Known Allergies   Thank you for allowing pharmacy to be a part of this patient's care.  Vernard Gambles, PharmD, BCPS  04/10/2019 6:51 AM

## 2019-04-10 NOTE — Progress Notes (Signed)
Patient ID: Marco Stevenson, male   DOB: 1955-06-09, 64 y.o.   MRN: [id number] [facility] Surgery Progress Note:   * No surgery found *  Subjective: Mental status is alert;  Minimal pain Objective: Vital signs in last 24 hours: Temp:  [97.9 F (36.6 C)-98.5 F (36.9 C)] 97.9 F (36.6 C) (02/19 2041) Pulse Rate:  [79-116] 86 (02/20 0821) Resp:  [16-35] 20 (02/20 0821) BP: (87-155)/(55-102) 111/85 (02/20 0821) SpO2:  [93 %-100 %] 96 % (02/20 0821)  Intake/Output from previous day: No intake/output data recorded. Intake/Output this shift: No intake/output data recorded.  Physical Exam: Work of breathing is not labored.  Minimal "stinging" in the right lower quadrant x 3 days.  No rebound or guarding  Lab Results:  Results for orders placed or performed during the hospital encounter of 04/09/19 (from the past 48 hour(s))  Lipase, blood     Status: None   Collection Time: 04/09/19  6:31 PM  Result Value Ref Range   Lipase 25 11 - 51 U/L    Comment: Performed at [facility], [address], [place], [place] [address]  Comprehensive metabolic panel     Status: Abnormal   Collection Time: 04/09/19  6:31 PM  Result Value Ref Range   Sodium 132 (L) 135 - 145 mmol/L   Potassium 3.7 3.5 - 5.1 mmol/L   Chloride 98 98 - 111 mmol/L   CO2 24 22 - 32 mmol/L   Glucose, Bld 105 (H) 70 - 99 mg/dL   BUN 9 8 - 23 mg/dL   Creatinine, Ser 3.42 0.61 - 1.24 mg/dL   Calcium 8.9 8.9 - 87.6 mg/dL   Total Protein 7.2 6.5 - 8.1 g/dL   Albumin 3.6 3.5 - 5.0 g/dL   AST 33 15 - 41 U/L   ALT 41 0 - 44 U/L   Alkaline Phosphatase 69 38 - 126 U/L   Total Bilirubin 1.5 (H) 0.3 - 1.2 mg/dL   GFR calc non Af Amer >60 >60 mL/min   GFR calc Af Amer >60 >60 mL/min   Anion gap 10 5 - 15    Comment: Performed at [facility], [address] [place], [place] [address]  CBC     Status: Abnormal   Collection Time: 04/09/19  6:31 PM  Result Value Ref Range   WBC 18.5 (H) 4.0 - 10.5 K/uL   RBC  5.10 4.22 - 5.81 MIL/uL   Hemoglobin 16.2 13.0 - 17.0 g/dL   HCT 26.2 03.5 - 59.7 %   MCV 91.4 80.0 - 100.0 fL   MCH 31.8 26.0 - 34.0 pg   MCHC 34.8 30.0 - 36.0 g/dL   RDW 41.6 38.4 - 53.6 %   Platelets 205 150 - 400 K/uL   nRBC 0.0 0.0 - 0.2 %    Comment: Performed at [facility], [address] [place], [place] [address]  Urinalysis, Routine w reflex microscopic     Status: Abnormal   Collection Time: 04/09/19  7:15 PM  Result Value Ref Range   Color, Urine YELLOW YELLOW   APPearance HAZY (A) CLEAR   Specific Gravity, Urine 1.020 1.005 - 1.030   pH 6.0 5.0 - 8.0   Glucose, UA NEGATIVE NEGATIVE mg/dL   Hgb urine dipstick NEGATIVE NEGATIVE   Bilirubin Urine NEGATIVE NEGATIVE   Ketones, ur NEGATIVE NEGATIVE mg/dL   Protein, ur 212 (A) NEGATIVE mg/dL   Nitrite NEGATIVE NEGATIVE   Leukocytes,Ua NEGATIVE NEGATIVE  RBC / HPF 0-5 0 - 5 RBC/hpf   WBC, UA 0-5 0 - 5 WBC/hpf   Bacteria, UA RARE (A) NONE SEEN   Squamous Epithelial / LPF 0-5 0 - 5   Mucus PRESENT     Comment: Performed at [facility], [address] [place], [place] [address]  Respiratory Panel by RT PCR (Flu A&B, Covid) - Nasopharyngeal Swab     Status: None   Collection Time: 04/09/19 11:32 PM   Specimen: Nasopharyngeal Swab  Result Value Ref Range   SARS Coronavirus 2 by RT PCR NEGATIVE NEGATIVE    Comment: (NOTE) SARS-CoV-2 target nucleic acids are NOT DETECTED. The SARS-CoV-2 RNA is generally detectable in upper respiratoy specimens during the acute phase of infection. The lowest concentration of SARS-CoV-2 viral copies this assay can detect is 131 copies/mL. A negative result does not preclude SARS-Cov-2 infection and should not be used as the sole basis for treatment or other patient management decisions. A negative result may occur with  improper specimen collection/handling, submission of specimen other than nasopharyngeal swab, presence of viral mutation(s) within the areas targeted by this  assay, and inadequate number of viral copies (<131 copies/mL). A negative result must be combined with clinical observations, patient history, and epidemiological information. The expected result is Negative. Fact Sheet for Patients:  https://www.moore.com/ Fact Sheet for Healthcare Providers:  https://www.young.biz/ This test is not yet ap proved or cleared by the [place] FDA and  has been authorized for detection and/or diagnosis of SARS-CoV-2 by FDA under an Emergency Use Authorization (EUA). This EUA will remain  in effect (meaning this test can be used) for the duration of the COVID-19 declaration under Section 564(b)(1) of the Act, 21 U.S.C. section 360bbb-3(b)(1), unless the authorization is terminated or revoked sooner.    Influenza A by PCR NEGATIVE NEGATIVE   Influenza B by PCR NEGATIVE NEGATIVE    Comment: (NOTE) The Xpert Xpress SARS-CoV-2/FLU/RSV assay is intended as an aid in  the diagnosis of influenza from Nasopharyngeal swab specimens and  should not be used as a sole basis for treatment. Nasal washings and  aspirates are unacceptable for Xpert Xpress SARS-CoV-2/FLU/RSV  testing. Fact Sheet for Patients: https://www.moore.com/ Fact Sheet for Healthcare Providers: https://www.young.biz/ This test is not yet approved or cleared by the [place] FDA and  has been authorized for detection and/or diagnosis of SARS-CoV-2 by  FDA under an Emergency Use Authorization (EUA). This EUA will remain  in effect (meaning this test can be used) for the duration of the  Covid-19 declaration under Section 564(b)(1) of the Act, 21  U.S.C. section 360bbb-3(b)(1), unless the authorization is  terminated or revoked. Performed at [facility], [address] [place], [place] [address]   Basic metabolic panel     Status: Abnormal   Collection Time: 04/10/19  6:50 AM  Result Value Ref Range    Sodium 135 135 - 145 mmol/L   Potassium 3.6 3.5 - 5.1 mmol/L   Chloride 104 98 - 111 mmol/L   CO2 18 (L) 22 - 32 mmol/L   Glucose, Bld 86 70 - 99 mg/dL   BUN 7 (L) 8 - 23 mg/dL   Creatinine, Ser 7.40 0.61 - 1.24 mg/dL   Calcium 8.0 (L) 8.9 - 10.3 mg/dL   GFR calc non Af Amer >60 >60 mL/min   GFR calc Af Amer >60 >60 mL/min   Anion gap 13 5 - 15    Comment: Performed at [facility], [address] [place], [place] [address]  CBC     Status: Abnormal   Collection Time: 04/10/19  6:50 AM  Result Value Ref Range   WBC 16.7 (H) 4.0 - 10.5 K/uL   RBC 4.45 4.22 - 5.81 MIL/uL   Hemoglobin 14.2 13.0 - 17.0 g/dL   HCT 51.7 00.1 - 74.9 %   MCV 94.2 80.0 - 100.0 fL   MCH 31.9 26.0 - 34.0 pg   MCHC 33.9 30.0 - 36.0 g/dL   RDW 44.9 67.5 - 91.6 %   Platelets 166 150 - 400 K/uL   nRBC 0.0 0.0 - 0.2 %    Comment: Performed at [facility], [address], [place], [place] [address]    Radiology/Results: CT ABDOMEN PELVIS W CONTRAST  Addendum Date: 04/09/2019   ADDENDUM REPORT: 04/09/2019 23:37 ADDENDUM: Study discussed by telephone with Dr. [name] on 04/09/2019 at 2332 hours. Electronically Signed   By: Odessa Fleming M.D.   On: 04/09/2019 23:37   Result Date: 04/09/2019 CLINICAL DATA:  64 year old male with right lower quadrant abdominal pain. EXAM: CT ABDOMEN AND PELVIS WITH CONTRAST TECHNIQUE: Multidetector CT imaging of the abdomen and pelvis was performed using the standard protocol following bolus administration of intravenous contrast. CONTRAST:  OMNIPAQUE IOHEXOL 300 MG/ML  SOLN COMPARISON:  CT Abdomen and Pelvis 05/13/2018. FINDINGS: Lower chest: Stable and negative. Hepatobiliary: Hepatic steatosis. Negative gallbladder. No bile duct enlargement. Pancreas: Atrophied but otherwise negative. Spleen: Negative. Adrenals/Urinary Tract: Normal adrenal glands. Bilateral renal enhancement and contrast excretion is symmetric and within normal limits. Occasional small benign  renal cysts. The right ureter passes through the area of inflammation in the right lower quadrant but seems to remain normal. Unremarkable urinary bladder. Stomach/Bowel: Negative distal sigmoid and rectum. Diverticulosis throughout the more redundant mid and proximal sigmoid. Diverticulosis continues into the descending colon, but no active inflammation is identified. Negative transverse colon and hepatic flexure. Negative ascending colon. There is a 9-10 cm area of moderate inflammation from the tip of the cecum through about 10 or 12 cm of the distal ileum. There is diverticulosis of the terminal ileum. And there is a small collection of extraluminal gas in the mesentery of the TI on coronal image 52. Nearby the appendix is identified and fluid-filled (coronal image 54). And the walls of the appendix may be mildly hyperenhancing, but it does not seem to be the epicenter of inflammation. The appendix is 6-8 mm diameter terminating on coronal image 60. By contrast the distal small bowel is thickened and inflamed with abundant mesenteric stranding. No free fluid or fluid collection. Upstream of the abnormal loops the more proximal ileum is nondilated and within normal limits. No free intraperitoneal air. There is a large chronic lobulated 6 cm diverticulum of the duodenum on series 3, image 45 with no active inflammation. The stomach appears negative. No free fluid. Vascular/Lymphatic: Major arterial structures are patent with only minimal iliac artery atherosclerosis identified. Portal venous system appears to be patent. Reproductive: Negative. Other: No pelvic free fluid. Musculoskeletal: Stable mild anterior wedge compression of T11. Chronic disc and endplate degeneration at the lumbosacral junction. No acute osseous abnormality identified. IMPRESSION: 1. Unusual appearance of Severe Diverticulitis of the distal SMALL BOWEL. The distal 12 cm of terminal ileum are severely inflamed and there is small Contained  Perforation into the mesentery (coronal image 52). But no abscess or drainable fluid collection. 2. Both the cecum and the appendix appear to be  secondarily inflamed. 3. No other acute findings. Distal large bowel diverticulosis. Duodenal diverticulum. Hepatic steatosis. Electronically Signed: By: Genevie Ann M.D. On: 04/09/2019 23:27    Anti-infectives: Anti-infectives (From admission, onward)   Start     Dose/Rate Route Frequency Ordered Stop   04/10/19 0700  piperacillin-tazobactam (ZOSYN) IVPB 3.375 g     3.375 g 12.5 mL/hr over 240 Minutes Intravenous Every 8 hours 04/10/19 0653     04/09/19 2345  piperacillin-tazobactam (ZOSYN) IVPB 3.375 g     3.375 g 100 mL/hr over 30 Minutes Intravenous  Once 04/09/19 2331 04/10/19 0018      Assessment/Plan: Problem List: Patient Active Problem List   Diagnosis Date Noted  . Enteritis 04/10/2019  . Syncopal episodes 05/12/2018  . UGI bleed 05/11/2018  . Hypertension 05/11/2018  . ETOH abuse 05/11/2018  . Acid reflux 05/11/2018  . Hypomagnesemia 05/11/2018  . Acute blood loss anemia 05/11/2018    Family history of Crohn's.  Followed by [facility].  May be IBD and needs appropriate workup by gastroenterology.   * No surgery found *    LOS: 0 days   Matt B. Hassell Done, MD, [facility] [phone] beeper [phone]  04/10/2019 9:41 AM

## 2019-04-10 NOTE — Consult Note (Signed)
[facility] Surgery Consult/Admission Note  Marco Stevenson [birth year]  [id number].    Requesting MD: Dr. Wyvonnia Dusky Chief Complaint/Reason for Consult: Right lower abdominal pain  HPI:  Marco Stevenson is a 64 y.o. male presenting to the ED with intermittent right lower abdominal pain. He states that the pain began 3 days ago and has not improved. He endorses flatus and denies constipation, diarrhea, or N/V. He notes decreased appetite He has never had this pain before.  He sees Dr Watt Climes for GI. Marland Kitchen He was admitted to the hospital May 2020 for upper GI bleeding. 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 Follow up EGD on 05/14/2018 showed a single duodenal polyp which was biopsied and negative for malignancy along with duodenal diverticulum. He states he has two family members with a history of crohn's disease (both 1st cousins on mothers side). His last colonoscopy was done at age 37 and was unremarkable per patient. He came today as the pain was not being relieved at home.  He had a ct scan done that shows significant terminal ileitis ? source  ROS: Review of Systems  Constitutional: Negative for chills and fever.  Respiratory: Negative for shortness of breath.   Cardiovascular: Negative for chest pain.  Gastrointestinal: Positive for abdominal pain. Negative for constipation, diarrhea, nausea and vomiting.  Genitourinary: Negative for dysuria and urgency.  Skin: Negative for itching and rash.  All other systems reviewed and are negative.   Family History  Problem Relation Age of Onset  . Hypertension Father   . Colon cancer Neg Hx   . Colon polyps Neg Hx     Past Medical History:  Diagnosis Date  . Acid reflux   . Hypertension     Past Surgical History:  Procedure Laterality Date  . BIOPSY  05/14/2018   Procedure: BIOPSY;  Surgeon: Clarene Essex, MD;  Location: [place];  Service: Endoscopy;;  . ESOPHAGOGASTRODUODENOSCOPY (EGD) WITH  PROPOFOL N/A 05/12/2018   Procedure: ESOPHAGOGASTRODUODENOSCOPY (EGD) WITH PROPOFOL;  Surgeon: Danie Binder, MD;  [facility];  Service: Endoscopy;  Laterality: N/A;  . ESOPHAGOGASTRODUODENOSCOPY (EGD) WITH PROPOFOL N/A 05/14/2018   Procedure: ESOPHAGOGASTRODUODENOSCOPY (EGD) WITH PROPOFOL;  Surgeon: Clarene Essex, MD;  Location: [place];  Service: Endoscopy;  Laterality: N/A;  start with side-viewing scope  . ESOPHAGOGASTRODUODENOSCOPY (EGD) WITH PROPOFOL N/A 09/23/2018   Procedure: ESOPHAGOGASTRODUODENOSCOPY (EGD) WITH PROPOFOL;  Surgeon: Arta Silence, MD;  Location: [facility];  Service: Endoscopy;  Laterality: N/A;  endo loop  . EUS N/A 09/23/2018   Procedure: UPPER ENDOSCOPIC ULTRASOUND (EUS) RADIAL;  Surgeon: Arta Silence, MD;  Location: [facility];  Service: Endoscopy;  Laterality: N/A;  . HERNIA REPAIR    . POLYPECTOMY  05/12/2018   Procedure: POLYPECTOMY;  Surgeon: Danie Binder, MD;  [facility];  Service: Endoscopy;;  polyp gastric  . POLYPECTOMY  09/23/2018   Procedure: POLYPECTOMY;  Surgeon: Arta Silence, MD;  [facility]: [facility];  Service: Endoscopy;;  . SUBMUCOSAL INJECTION  09/23/2018   Procedure: SUBMUCOSAL INJECTION;  Surgeon: Arta Silence, MD;  [facility]: [facility];  Service: Endoscopy;;    Social History:  reports that he has never smoked. He has never used smokeless tobacco. He reports current alcohol use. He reports that he does not use drugs.  Allergies: No Known Allergies  Meds reviewed  Blood pressure (!) 124/101, pulse 95, temperature 97.9 F (36.6 C), temperature source Oral, resp. rate (!) 31, SpO2 98 %. Physical Exam:  General: pleasant,  WD, WN white male who is laying in bed in NAD HEENT: head is normocephalic, atraumatic.  Sclera are noninjected.  PERRL.  Ears and nose without any masses or lesions. Heart: regular, rate, and rhythm.  Normal s1,s2. No obvious murmurs, gallops, or rubs noted.  Palpable radial and pedal  pulses bilaterally Lungs: CTAB, no wheezes, rhonchi, or rales noted.  Respiratory effort nonlabored Abd: soft, mildly tender to the right lower quadrant, no peritoneal signs, ND, +BS, no masses, hernias, or organomegaly MS: all 4 extremities are symmetrical with no cyanosis, clubbing, or edema. Skin: warm and dry with no masses, lesions, or rashes Neuro: Cranial nerves 2-12 grossly intact, speech is normal Psych: A&Ox3 with an appropriate affect.   Results for orders placed or performed during the hospital encounter of 04/09/19 (from the past 48 hour(s))  Lipase, blood     Status: None   Collection Time: 04/09/19  6:31 PM  Result Value Ref Range   Lipase 25 11 - 51 U/L    Comment: Performed at [facility], [address] [place], [place] [address]  Comprehensive metabolic panel     Status: Abnormal   Collection Time: 04/09/19  6:31 PM  Result Value Ref Range   Sodium 132 (L) 135 - 145 mmol/L   Potassium 3.7 3.5 - 5.1 mmol/L   Chloride 98 98 - 111 mmol/L   CO2 24 22 - 32 mmol/L   Glucose, Bld 105 (H) 70 - 99 mg/dL   BUN 9 8 - 23 mg/dL   Creatinine, Ser 4.43 0.61 - 1.24 mg/dL   Calcium 8.9 8.9 - 15.4 mg/dL   Total Protein 7.2 6.5 - 8.1 g/dL   Albumin 3.6 3.5 - 5.0 g/dL   AST 33 15 - 41 U/L   ALT 41 0 - 44 U/L   Alkaline Phosphatase 69 38 - 126 U/L   Total Bilirubin 1.5 (H) 0.3 - 1.2 mg/dL   GFR calc non Af Amer >60 >60 mL/min   GFR calc Af Amer >60 >60 mL/min   Anion gap 10 5 - 15    Comment: Performed at [facility], [address] [place], [place] [address]  CBC     Status: Abnormal   Collection Time: 04/09/19  6:31 PM  Result Value Ref Range   WBC 18.5 (H) 4.0 - 10.5 K/uL   RBC 5.10 4.22 - 5.81 MIL/uL   Hemoglobin 16.2 13.0 - 17.0 g/dL   HCT 61.9 50.9 - 32.6 %   MCV 91.4 80.0 - 100.0 fL   MCH 31.8 26.0 - 34.0 pg   MCHC 34.8 30.0 - 36.0 g/dL   RDW 71.2 45.8 - 09.9 %   Platelets 205 150 - 400 K/uL   nRBC 0.0 0.0 - 0.2 %    Comment: Performed at [facility], [address] [place], [place] [address]  Urinalysis, Routine w reflex microscopic     Status: Abnormal   Collection Time: 04/09/19  7:15 PM  Result Value Ref Range   Color, Urine YELLOW YELLOW   APPearance HAZY (A) CLEAR   Specific Gravity, Urine 1.020 1.005 - 1.030   pH 6.0 5.0 - 8.0   Glucose, UA NEGATIVE NEGATIVE mg/dL   Hgb urine dipstick NEGATIVE NEGATIVE   Bilirubin Urine NEGATIVE NEGATIVE   Ketones, ur NEGATIVE NEGATIVE mg/dL   Protein, ur 505 (A) NEGATIVE mg/dL   Nitrite NEGATIVE NEGATIVE   Leukocytes,Ua NEGATIVE NEGATIVE   RBC / HPF 0-5 0 -  5 RBC/hpf   WBC, UA 0-5 0 - 5 WBC/hpf   Bacteria, UA RARE (A) NONE SEEN   Squamous Epithelial / LPF 0-5 0 - 5   Mucus PRESENT     Comment: Performed at [facility], [address] [place], [place] [address]   CT ABDOMEN PELVIS W CONTRAST  Addendum Date: 04/09/2019   ADDENDUM REPORT: 04/09/2019 23:37 ADDENDUM: Study discussed by telephone with Dr. [name] on 04/09/2019 at 2332 hours. Electronically Signed   By: Odessa Fleming M.D.   On: 04/09/2019 23:37   Result Date: 04/09/2019 CLINICAL DATA:  63 year old male with right lower quadrant abdominal pain. EXAM: CT ABDOMEN AND PELVIS WITH CONTRAST TECHNIQUE: Multidetector CT imaging of the abdomen and pelvis was performed using the standard protocol following bolus administration of intravenous contrast. CONTRAST:  OMNIPAQUE IOHEXOL 300 MG/ML  SOLN COMPARISON:  CT Abdomen and Pelvis 05/13/2018. FINDINGS: Lower chest: Stable and negative. Hepatobiliary: Hepatic steatosis. Negative gallbladder. No bile duct enlargement. Pancreas: Atrophied but otherwise negative. Spleen: Negative. Adrenals/Urinary Tract: Normal adrenal glands. Bilateral renal enhancement and contrast excretion is symmetric and within normal limits. Occasional small benign renal cysts. The right ureter passes through the area of inflammation in the right lower quadrant but seems to remain normal.  Unremarkable urinary bladder. Stomach/Bowel: Negative distal sigmoid and rectum. Diverticulosis throughout the more redundant mid and proximal sigmoid. Diverticulosis continues into the descending colon, but no active inflammation is identified. Negative transverse colon and hepatic flexure. Negative ascending colon. There is a 9-10 cm area of moderate inflammation from the tip of the cecum through about 10 or 12 cm of the distal ileum. There is diverticulosis of the terminal ileum. And there is a small collection of extraluminal gas in the mesentery of the TI on coronal image 52. Nearby the appendix is identified and fluid-filled (coronal image 54). And the walls of the appendix may be mildly hyperenhancing, but it does not seem to be the epicenter of inflammation. The appendix is 6-8 mm diameter terminating on coronal image 60. By contrast the distal small bowel is thickened and inflamed with abundant mesenteric stranding. No free fluid or fluid collection. Upstream of the abnormal loops the more proximal ileum is nondilated and within normal limits. No free intraperitoneal air. There is a large chronic lobulated 6 cm diverticulum of the duodenum on series 3, image 45 with no active inflammation. The stomach appears negative. No free fluid. Vascular/Lymphatic: Major arterial structures are patent with only minimal iliac artery atherosclerosis identified. Portal venous system appears to be patent. Reproductive: Negative. Other: No pelvic free fluid. Musculoskeletal: Stable mild anterior wedge compression of T11. Chronic disc and endplate degeneration at the lumbosacral junction. No acute osseous abnormality identified. IMPRESSION: 1. Unusual appearance of Severe Diverticulitis of the distal SMALL BOWEL. The distal 12 cm of terminal ileum are severely inflamed and there is small Contained Perforation into the mesentery (coronal image 52). But no abscess or drainable fluid collection. 2. Both the cecum and the  appendix appear to be secondarily inflamed. 3. No other acute findings. Distal large bowel diverticulosis. Duodenal diverticulum. Hepatic steatosis. Electronically Signed: By: Odessa Fleming M.D. On: 04/09/2019 23:27      Assessment/Plan Ileal enteritis -Atypical small bowel diverticulitis versus enteritis of ileum continuing into the cecum.  -Infectious versus autoimmune cause -consult GI (Pt sees Dr. Ewing Schlein) -Not a surgical candidate at this time, with no acute abdomen, afebrile, however WBC 18.5 -Bowel rest with NPO  -Zosyn IV  FEN:  IV fluids, NPO VTE: SCDs, Lovenox ID: Zosyn stated on 2/20 Dispo: Admit to general surgery, will reevaluate for improving infection/inflammtion  Admit to General Surgery  Altamease Oiler, PA-S [facility] 04/10/2019, 12:26 AM Please see Amion for pager number during day hours 7:00am-4:30pm   I have seen and examined patient. He has only minimal abdominal tenderness, without peritonitis. I do not think he needs surgery now. His wbc is elevated.  He does not appear to have appendicitis.  I think small bowel diverticulitis is possible as read in ct but so are enteritis and Crohns disease. I think admission with abx, possibly have GI see him tomorrow is reasonable.  Discussed plan with patient. I agree with above and have made changes as needed and additions.

## 2019-04-11 LAB — CBC
HCT: 39.2 % (ref 39.0–52.0)
Hemoglobin: 13.3 g/dL (ref 13.0–17.0)
MCH: 31.7 pg (ref 26.0–34.0)
MCHC: 33.9 g/dL (ref 30.0–36.0)
MCV: 93.3 fL (ref 80.0–100.0)
Platelets: 140 10*3/uL — ABNORMAL LOW (ref 150–400)
RBC: 4.2 MIL/uL — ABNORMAL LOW (ref 4.22–5.81)
RDW: 12.8 % (ref 11.5–15.5)
WBC: 9 10*3/uL (ref 4.0–10.5)
nRBC: 0 % (ref 0.0–0.2)

## 2019-04-11 NOTE — Progress Notes (Signed)
Subjective/Chief Complaint: Reports pain much less Having diarrhea Wants to advance diet   Objective: Vital signs in last 24 hours: Temp:  [98.3 F (36.8 C)-99.2 F (37.3 C)] 98.3 F (36.8 C) (02/21 0327) Pulse Rate:  [64-84] 64 (02/21 0327) Resp:  [16-17] 17 (02/21 0327) BP: (124-132)/(71-87) 126/71 (02/21 0327) SpO2:  [96 %-98 %] 96 % (02/21 0327) Weight:  [98.1 kg] 98.1 kg (02/21 0700) Last BM Date: 04/10/19  Intake/Output from previous day: 02/20 0701 - 02/21 0700 In: 2277.7 [P.O.:570; I.V.:1707.7] Out: -  Intake/Output this shift: No intake/output data recorded.  Exam: Awake and alert Abdomen mildly distended, minimally tender in the RLQ  Lab Results:  Recent Labs    04/10/19 0650 04/11/19 0321  WBC 16.7* 9.0  HGB 14.2 13.3  HCT 41.9 39.2  PLT 166 140*   BMET Recent Labs    04/09/19 1831 04/10/19 0650  NA 132* 135  K 3.7 3.6  CL 98 104  CO2 24 18*  GLUCOSE 105* 86  BUN 9 7*  CREATININE 1.07 1.07  CALCIUM 8.9 8.0*   PT/INR No results for input(s): LABPROT, INR in the last 72 hours. ABG No results for input(s): PHART, HCO3 in the last 72 hours.  Invalid input(s): PCO2, PO2  Studies/Results: CT ABDOMEN PELVIS W CONTRAST  Addendum Date: 04/09/2019   ADDENDUM REPORT: 04/09/2019 23:37 ADDENDUM: Study discussed by telephone with Dr. [name] on 04/09/2019 at 2332 hours. Electronically Signed   By: Odessa Fleming M.D.   On: 04/09/2019 23:37   Result Date: 04/09/2019 CLINICAL DATA:  64 year old male with right lower quadrant abdominal pain. EXAM: CT ABDOMEN AND PELVIS WITH CONTRAST TECHNIQUE: Multidetector CT imaging of the abdomen and pelvis was performed using the standard protocol following bolus administration of intravenous contrast. CONTRAST:  OMNIPAQUE IOHEXOL 300 MG/ML  SOLN COMPARISON:  CT Abdomen and Pelvis 05/13/2018. FINDINGS: Lower chest: Stable and negative. Hepatobiliary: Hepatic steatosis. Negative gallbladder. No bile duct  enlargement. Pancreas: Atrophied but otherwise negative. Spleen: Negative. Adrenals/Urinary Tract: Normal adrenal glands. Bilateral renal enhancement and contrast excretion is symmetric and within normal limits. Occasional small benign renal cysts. The right ureter passes through the area of inflammation in the right lower quadrant but seems to remain normal. Unremarkable urinary bladder. Stomach/Bowel: Negative distal sigmoid and rectum. Diverticulosis throughout the more redundant mid and proximal sigmoid. Diverticulosis continues into the descending colon, but no active inflammation is identified. Negative transverse colon and hepatic flexure. Negative ascending colon. There is a 9-10 cm area of moderate inflammation from the tip of the cecum through about 10 or 12 cm of the distal ileum. There is diverticulosis of the terminal ileum. And there is a small collection of extraluminal gas in the mesentery of the TI on coronal image 52. Nearby the appendix is identified and fluid-filled (coronal image 54). And the walls of the appendix may be mildly hyperenhancing, but it does not seem to be the epicenter of inflammation. The appendix is 6-8 mm diameter terminating on coronal image 60. By contrast the distal small bowel is thickened and inflamed with abundant mesenteric stranding. No free fluid or fluid collection. Upstream of the abnormal loops the more proximal ileum is nondilated and within normal limits. No free intraperitoneal air. There is a large chronic lobulated 6 cm diverticulum of the duodenum on series 3, image 45 with no active inflammation. The stomach appears negative. No free fluid. Vascular/Lymphatic: Major arterial structures are patent with only minimal iliac artery atherosclerosis identified. Portal venous  system appears to be patent. Reproductive: Negative. Other: No pelvic free fluid. Musculoskeletal: Stable mild anterior wedge compression of T11. Chronic disc and endplate degeneration at the  lumbosacral junction. No acute osseous abnormality identified. IMPRESSION: 1. Unusual appearance of Severe Diverticulitis of the distal SMALL BOWEL. The distal 12 cm of terminal ileum are severely inflamed and there is small Contained Perforation into the mesentery (coronal image 52). But no abscess or drainable fluid collection. 2. Both the cecum and the appendix appear to be secondarily inflamed. 3. No other acute findings. Distal large bowel diverticulosis. Duodenal diverticulum. Hepatic steatosis. Electronically Signed: By: Genevie Ann M.D. On: 04/09/2019 23:27    Anti-infectives: Anti-infectives (From admission, onward)   Start     Dose/Rate Route Frequency Ordered Stop   04/10/19 0700  piperacillin-tazobactam (ZOSYN) IVPB 3.375 g     3.375 g 12.5 mL/hr over 240 Minutes Intravenous Every 8 hours 04/10/19 0653     04/09/19 2345  piperacillin-tazobactam (ZOSYN) IVPB 3.375 g     3.375 g 100 mL/hr over 30 Minutes Intravenous  Once 04/09/19 2331 04/10/19 0018      Assessment/Plan:  Ileal enteritis  WBC now normal and patient is improving  Will try a soft diet Continue antibiotics GI has seen and plans outlined for follow-up. Decrease IVF   LOS: 1 day    Coralie Keens 04/11/2019

## 2019-04-11 NOTE — Plan of Care (Signed)
  Problem: Activity: Goal: Risk for activity intolerance will decrease Outcome: Progressing   Problem: Nutrition: Goal: Adequate nutrition will be maintained Outcome: Progressing   Problem: Elimination: Goal: Will not experience complications related to bowel motility Outcome: Progressing   

## 2019-04-11 NOTE — Plan of Care (Signed)
  Problem: Education: Goal: Knowledge of General Education information will improve Description: Including pain rating scale, medication(s)/side effects and non-pharmacologic comfort measures Outcome: Progressing   Problem: Activity: Goal: Risk for activity intolerance will decrease Outcome: Progressing   Problem: Nutrition: Goal: Adequate nutrition will be maintained Outcome: Progressing   

## 2019-04-11 NOTE — Progress Notes (Signed)
[facility] Gastroenterology Progress Note  Marco Stevenson 64 y.o. [birth year]  CC: Abdominal pain, abnormal CT scan   Subjective: Feeling much better today.  Abdominal pain has resolved but complaining of mild abdominal discomfort.  Denies nausea vomiting.  Tolerating diet.  Few episodes of loose stool.  ROS : Negative for chest pain and shortness of breath   Objective: Vital signs in last 24 hours: Vitals:   04/10/19 2147 04/11/19 0327  BP: 132/82 126/71  Pulse: 78 64  Resp:  17  Temp: 99.2 F (37.3 C) 98.3 F (36.8 C)  SpO2: 97% 96%    Physical Exam:  General:  Alert, cooperative, no distress, appears stated age  Head:  Normocephalic, without obvious abnormality, atraumatic        Heart:  Regular rate and rhythm, S1, S2 normal  Abdomen:   Soft, non-tender, nondistended, bowel sounds present, no peritoneal signs  Extremities: Extremities normal, atraumatic, no  edema       Lab Results: Recent Labs    04/09/19 1831 04/10/19 0650  NA 132* 135  K 3.7 3.6  CL 98 104  CO2 24 18*  GLUCOSE 105* 86  BUN 9 7*  CREATININE 1.07 1.07  CALCIUM 8.9 8.0*   Recent Labs    04/09/19 1831  AST 33  ALT 41  ALKPHOS 69  BILITOT 1.5*  PROT 7.2  ALBUMIN 3.6   Recent Labs    04/10/19 0650 04/11/19 0321  WBC 16.7* 9.0  HGB 14.2 13.3  HCT 41.9 39.2  MCV 94.2 93.3  PLT 166 140*   No results for input(s): LABPROT, INR in the last 72 hours.    Assessment/Plan: -Lower abdominal pain with CT scan showing unusual finding of severe diverticulitis of the distal small bowel. -Leukocytosis.  Probably from underlying diverticulitis.  Normal LFTs.  Normal lipase -History of large pedunculated polyp in the duodenal bulb.  Pathology revealed juvenile type polyp  Recommendations ----------------------- -Given lack of previous chronic GI symptoms less likely from Crohn's disease but is a possibility. -Okay to switch to oral antibiotics from GI standpoint.  Recommend antibiotics  for 14 days -Recommend CT enterography in 4 weeks after discharge prior to doing colonoscopy -GI will sign off.  Follow-up with Dr. Ewing Schlein  in 2 to 3 weeks after discharge   Kathi Der MD, FACP 04/11/2019, 10:23 AM  Contact #  [phone]

## 2019-04-11 NOTE — Plan of Care (Signed)

## 2019-04-12 LAB — CBC
HCT: 38.9 % — ABNORMAL LOW (ref 39.0–52.0)
Hemoglobin: 13.4 g/dL (ref 13.0–17.0)
MCH: 31.5 pg (ref 26.0–34.0)
MCHC: 34.4 g/dL (ref 30.0–36.0)
MCV: 91.3 fL (ref 80.0–100.0)
Platelets: 175 10*3/uL (ref 150–400)
RBC: 4.26 MIL/uL (ref 4.22–5.81)
RDW: 12.5 % (ref 11.5–15.5)
WBC: 7.2 10*3/uL (ref 4.0–10.5)
nRBC: 0 % (ref 0.0–0.2)

## 2019-04-12 MED ORDER — SACCHAROMYCES BOULARDII 250 MG PO CAPS: ORAL_CAPSULE | ORAL | Status: DC

## 2019-04-12 MED ORDER — AMOXICILLIN-POT CLAVULANATE 875-125 MG PO TABS
1.0000 | ORAL_TABLET | Freq: Two times a day (BID) | ORAL | Status: DC
  Administered 2019-04-12: 1 via ORAL
  Filled 2019-04-12: qty 1

## 2019-04-12 MED ORDER — SACCHAROMYCES BOULARDII 250 MG PO CAPS
250.0000 mg | ORAL_CAPSULE | Freq: Two times a day (BID) | ORAL | Status: DC
  Administered 2019-04-12: 10:00:00 250 mg via ORAL
  Filled 2019-04-12: qty 1

## 2019-04-12 MED ORDER — ACETAMINOPHEN 325 MG PO TABS: 650.0000 mg | ORAL_TABLET | Freq: Four times a day (QID) | ORAL | Status: AC | PRN

## 2019-04-12 MED ORDER — AMOXICILLIN-POT CLAVULANATE 875-125 MG PO TABS: 1.0000 | ORAL_TABLET | Freq: Two times a day (BID) | ORAL | 0 refills | Status: DC

## 2019-04-12 NOTE — Discharge Instructions (Signed)
Diverticulitis Call Dr. Marlane Hatcher office if you have further issues  Diverticulitis is when small pockets in your large intestine (colon) get infected or swollen. This causes stomach pain and watery poop (diarrhea). These pouches are called diverticula. They form in people who have a condition called diverticulosis. Follow these instructions at home: Medicines  Take over-the-counter and prescription medicines only as told by your doctor. These include: ? Antibiotics. ? Pain medicines. ? Fiber pills. ? Probiotics. ? Stool softeners.  Do not drive or use heavy machinery while taking prescription pain medicine.  If you were prescribed an antibiotic, take it as told. Do not stop taking it even if you feel better. General instructions   Follow a diet as told by your doctor.  When you feel better, your doctor may tell you to change your diet. You may need to eat a lot of fiber. Fiber makes it easier to poop (have bowel movements). Healthy foods with fiber include: ? Berries. ? Beans. ? Lentils. ? Green vegetables.  Exercise 3 or more times a week. Aim for 30 minutes each time. Exercise enough to sweat and make your heart beat faster.  Keep all follow-up visits as told. This is important. You may need to have an exam of the large intestine. This is called a colonoscopy. Contact a doctor if:  Your pain does not get better.  You have a hard time eating or drinking.  You are not pooping like normal. Get help right away if:  Your pain gets worse.  Your problems do not get better.  Your problems get worse very fast.  You have a fever.  You throw up (vomit) more than one time.  You have poop that is: ? Bloody. ? Black. ? Tarry. Summary  Diverticulitis is when small pockets in your large intestine (colon) get infected or swollen.  Take medicines only as told by your doctor.  Follow a diet as told by your doctor. This information is not intended to replace advice given to  you by your health care provider. Make sure you discuss any questions you have with your health care provider. Document Revised: 01/17/2017 Document Reviewed: 02/22/2016 Elsevier Patient Education  2020 [employer].

## 2019-04-12 NOTE — Progress Notes (Signed)
CC: Abdominal pain  Subjective: Patient is doing better little bit sore this a.m. but overall doing well.  Still having loose stools.  He feels much better overall.  Objective: Vital signs in last 24 hours: Temp:  [97.6 F (36.4 C)-98.2 F (36.8 C)] 97.6 F (36.4 C) (02/22 0523) Pulse Rate:  [60-74] 60 (02/22 0523) Resp:  [16] 16 (02/22 0523) BP: (120-134)/(84-88) 120/85 (02/22 0523) SpO2:  [97 %-98 %] 98 % (02/22 0523) Last BM Date: 04/10/19 320 p.o. recorded 1300 IV Urine x3 recorded No BM recorded Afebrile vital signs are stable WBC 7.2 CT 2/19 shows severe diverticulitis distal small bowel.  The distal 12 cm of ileum are severely inflamed there is a small contained perforation in the mesentery.  No abscess or drainable fluid collection.  Both the cecum and appendix appear to be secondarily inflamed.  No other acute findings distal large bowel diverticulosis, duodenal diverticulum, hepatic steatosis.  Intake/Output from previous day: 02/21 0701 - 02/22 0700 In: 1649.5 [P.O.:320; I.V.:1122.1; IV Piggyback:207.4] Out: -  Intake/Output this shift: No intake/output data recorded.  General appearance: alert, cooperative and no distress Resp: clear to auscultation bilaterally Cardio: Regular rate and rhythm GI: Soft, still a bit sore, tolerating a soft diet, still having loose stools Extremities: extremities normal, atraumatic, no cyanosis or edema  Lab Results:  Recent Labs    04/11/19 0321 04/12/19 0219  WBC 9.0 7.2  HGB 13.3 13.4  HCT 39.2 38.9*  PLT 140* 175    BMET Recent Labs    04/09/19 1831 04/10/19 0650  NA 132* 135  K 3.7 3.6  CL 98 104  CO2 24 18*  GLUCOSE 105* 86  BUN 9 7*  CREATININE 1.07 1.07  CALCIUM 8.9 8.0*   PT/INR No results for input(s): LABPROT, INR in the last 72 hours.  Recent Labs  Lab 04/09/19 1831  AST 33  ALT 41  ALKPHOS 69  BILITOT 1.5*  PROT 7.2  ALBUMIN 3.6     Lipase     Component Value Date/Time   LIPASE 25 04/09/2019 1831   Prior to Admission medications   Medication Sig Start Date End Date Taking? Authorizing Provider  ALPRAZolam Duanne Moron) 1 MG tablet Take 0.5 mg by mouth at bedtime as needed for sleep.  03/13/18  Yes [provider]  losartan (COZAAR) 100 MG tablet Take 100 mg by mouth daily.  04/08/18  Yes [provider]  Multiple Vitamin (MULTIVITAMIN WITH MINERALS) TABS tablet Take 1 tablet by mouth daily. 05/15/18  Yes Aline August, MD  pantoprazole (PROTONIX) 40 MG tablet Take 1 tablet (40 mg total) by mouth daily. 05/15/18  Yes Aline August, MD  sildenafil (VIAGRA) 25 MG tablet Take 25 mg by mouth as needed for erectile dysfunction.    Yes [provider]  Tetrahydrozoline HCl (VISINE OP) Place 1 drop into both eyes daily as needed (dry eyes).   Yes [provider]  folic acid (FOLVITE) 1 MG tablet Take 1 tablet (1 mg total) by mouth daily. Patient not taking: Reported on 09/18/2018 05/15/18   Aline August, MD  ondansetron (ZOFRAN) 4 MG tablet Take 1 tablet (4 mg total) by mouth every 6 (six) hours as needed for nausea. Patient not taking: Reported on 09/18/2018 05/15/18   Aline August, MD  predniSONE (DELTASONE) 20 MG tablet Take 2 tablets (40 mg total) by mouth daily with breakfast. Patient not taking: Reported on 04/10/2019 12/07/18   Orpah Greek, MD  thiamine 100 MG  tablet Take 1 tablet (100 mg total) by mouth daily. Patient not taking: Reported on 04/10/2019 05/15/18   Glade Lloyd, MD      Medications: . enoxaparin (LOVENOX) injection  40 mg Subcutaneous Q24H  . losartan  100 mg Oral Daily  . pantoprazole  40 mg Oral Daily    Assessment/Plan Hx GERD Hypertension    Ileal enteritis  - Hx possible IBD   FEN: Soft diet ID: Zosyn 2/19 >> day 3 DVT: Lovenox Follow-up: Dr. Ancil Linsey in 2 to 3 weeks  -CT enterography 4 weeks; prior to colonoscopy  -Follow-up Dr. Ewing Schlein 2 to 3 weeks  -14 days total of antibiotics.  Plan:  Home today on 12 more days of oral antibiotics, probiotic, follow-up with Dr. Ewing Schlein.      LOS: 2 days    , 04/12/2019 Please see Amion

## 2019-04-12 NOTE — Discharge Summary (Signed)
Physician Discharge Summary  Patient ID: Marco Stevenson MRN: [id number] DOB/AGE: [birth year] 64 y.o.  Admit date: 04/09/2019 Discharge date: 04/12/2019  Admission Diagnoses:  Right lower abdominal pain Ileal enteritis with Hx of possible IBD GERD Hypertension  Discharge Diagnoses:  Same   Active Problems:   Enteritis   PROCEDURES: None  Hospital Course:  Marco Stevenson. Marco Stevenson is a 64 y.o. male presenting to the ED with intermittent right lower abdominal pain. He states that the pain began 3 days ago and has not improved. He endorses flatus and denies constipation, diarrhea, or N/V. He notes decreased appetite He has never had this pain before.  He sees Dr Watt Climes for GI. Marland Kitchen He was admitted to the hospital May 2020 for upper GI bleeding. 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 Follow up EGD on 05/14/2018 showed a single duodenal polyp which was biopsied and negative for malignancy along with duodenal diverticulum. He states he has two family members with a history of crohn's disease (both 1st cousins on mothers side). His last colonoscopy was done at age 55 and was unremarkable per patient. He came today as the pain was not being relieved at home.  He had a ct scan done that shows significant terminal ileitis ? Source He was seen in the ED by Dr. Rolm Bookbinder and admitted with ileal enteritis, atypical small bowel diverticulitis versus enteritis of the ileum continuing into the cecum.  We could not tell whether this was an infectious process or an autoimmune process GI consult with Dr.Brahmbhatt  was obtained.  He agreed with antibiotics, full liquid diet and to hold off on colonoscopy given his acute diverticulitis.  He was placed on IV antibiotics.  GI recommended he follow-up in 4 weeks with a CT enterography and with Dr. Clarene Essex.  By the a.m. of 04/12/2019 he was sore but overall doing well having loose stools but felt much better.  He was seen by Dr.  Greer Pickerel was his opinion he be discharged home with completion of antibiotics as an outpatient.  Follow-up with Dr. Watt Climes, CT enterography, followed by colonoscopy.  He was discharged on medicines as listed below.  Follow-up as also listed below.  CBC Latest Ref Rng & Units 04/12/2019 04/11/2019 04/10/2019  WBC 4.0 - 10.5 K/uL 7.2 9.0 16.7(H)  Hemoglobin 13.0 - 17.0 g/dL 13.4 13.3 14.2  Hematocrit 39.0 - 52.0 % 38.9(L) 39.2 41.9  Platelets 150 - 400 K/uL 175 140(L) 166   CMP Latest Ref Rng & Units 04/10/2019 04/09/2019 12/07/2018  Glucose 70 - 99 mg/dL 86 105(H) 104(H)  BUN 8 - 23 mg/dL 7(L) 9 10  Creatinine 0.61 - 1.24 mg/dL 1.07 1.07 0.82  Sodium 135 - 145 mmol/L 135 132(L) 137  Potassium 3.5 - 5.1 mmol/L 3.6 3.7 3.6  Chloride 98 - 111 mmol/L 104 98 104  CO2 22 - 32 mmol/L 18(L) 24 24  Calcium 8.9 - 10.3 mg/dL 8.0(L) 8.9 8.8(L)  Total Protein 6.5 - 8.1 g/dL - 7.2 -  Total Bilirubin 0.3 - 1.2 mg/dL - 1.5(H) -  Alkaline Phos 38 - 126 U/L - 69 -  AST 15 - 41 U/L - 33 -  ALT 0 - 44 U/L - 41 -   Condition on DC:  Improved   Disposition: Discharge disposition: 01-Home or Self Care        Allergies as of 04/12/2019   No Known Allergies     Medication List    STOP taking these  medications   folic acid 1 MG tablet Commonly known as: FOLVITE   ondansetron 4 MG tablet Commonly known as: ZOFRAN   predniSONE 20 MG tablet Commonly known as: DELTASONE   thiamine 100 MG tablet     TAKE these medications   acetaminophen 325 MG tablet Commonly known as: TYLENOL Take 2 tablets (650 mg total) by mouth every 6 (six) hours as needed for mild pain or moderate pain (Call Dr. Marlane Hatcher office if you need more pain medicine).   ALPRAZolam 1 MG tablet Commonly known as: XANAX Take 0.5 mg by mouth at bedtime as needed for sleep.   amoxicillin-clavulanate 875-125 MG tablet Commonly known as: AUGMENTIN Take 1 tablet by mouth every 12 (twelve) hours.   losartan 100 MG tablet Commonly  known as: COZAAR Take 100 mg by mouth daily.   multivitamin with minerals Tabs tablet Take 1 tablet by mouth daily.   pantoprazole 40 MG tablet Commonly known as: PROTONIX Take 1 tablet (40 mg total) by mouth daily.   saccharomyces boulardii 250 MG capsule Commonly known as: FLORASTOR This is a probiotic.  You can buy it over-the-counter at any drugstore.  You pick 1 up and follow package directions for the next 30 days.   sildenafil 25 MG tablet Commonly known as: VIAGRA Take 25 mg by mouth as needed for erectile dysfunction.   VISINE OP Place 1 drop into both eyes daily as needed (dry eyes).      Follow-up Information    Vida Rigger, MD. Schedule an appointment as soon as possible for a visit in 2 week(s).   Specialty: Gastroenterology Why: Abnormal CT scan, needs outpatient CT enterography and colonoscopy Contact information: [address] [place] [address] [phone]           Signed: Sherrie George 04/12/2019, 9:30 AM

## 2019-04-12 NOTE — Progress Notes (Signed)
PT ready for DC to home, DC instructions reviewed and copy given.  PT understands follow up with Dr. Ewing Schlein in 2 weeks.  Work Physicist, medical given.

## 2019-04-21 ENCOUNTER — Other Ambulatory Visit: Payer: Self-pay

## 2019-04-21 ENCOUNTER — Ambulatory Visit
Admission: EM | Admit: 2019-04-21 | Discharge: 2019-04-21 | Disposition: A | Discharge status: Home / Self Care | Payer: Commercial Managed Care - PPO | Attending: Emergency Medicine | Admitting: Emergency Medicine

## 2019-04-21 DIAGNOSIS — K5792 Diverticulitis of intestine, part unspecified, without perforation or abscess without bleeding: Secondary | ICD-10-CM | POA: Diagnosis not present

## 2019-04-21 LAB — CBC WITH DIFFERENTIAL/PLATELET
Basophils Absolute: 0.1 10*3/uL (ref 0.0–0.2)
Basos: 1 %
EOS (ABSOLUTE): 0.2 10*3/uL (ref 0.0–0.4)
Eos: 3 %
Hematocrit: 44.2 % (ref 37.5–51.0)
Hemoglobin: 15.8 g/dL (ref 13.0–17.7)
Immature Grans (Abs): 0 10*3/uL (ref 0.0–0.1)
Immature Granulocytes: 0 %
Lymphocytes Absolute: 1.8 10*3/uL (ref 0.7–3.1)
Lymphs: 30 %
MCH: 32.5 pg (ref 26.6–33.0)
MCHC: 35.7 g/dL (ref 31.5–35.7)
MCV: 91 fL (ref 79–97)
Monocytes Absolute: 0.9 10*3/uL (ref 0.1–0.9)
Monocytes: 16 %
Neutrophils Absolute: 2.9 10*3/uL (ref 1.4–7.0)
Neutrophils: 50 %
Platelets: 274 10*3/uL (ref 150–450)
RBC: 4.86 x10E6/uL (ref 4.14–5.80)
RDW: 13.2 % (ref 11.6–15.4)
WBC: 5.9 10*3/uL (ref 3.4–10.8)

## 2019-04-21 LAB — BASIC METABOLIC PANEL
BUN/Creatinine Ratio: 7 — ABNORMAL LOW (ref 10–24)
BUN: 6 mg/dL — ABNORMAL LOW (ref 8–27)
CO2: 25 mmol/L (ref 20–29)
Calcium: 8.8 mg/dL (ref 8.6–10.2)
Chloride: 103 mmol/L (ref 96–106)
Creatinine, Ser: 0.84 mg/dL (ref 0.76–1.27)
GFR calc Af Amer: 109 mL/min/{1.73_m2} (ref >59)
GFR calc non Af Amer: 95 mL/min/{1.73_m2} (ref >59)
Glucose: 91 mg/dL (ref 65–99)
Potassium: 4.3 mmol/L (ref 3.5–5.2)
Sodium: 141 mmol/L (ref 134–144)

## 2019-04-21 LAB — LIPASE: Lipase: 46 U/L (ref 13–78)

## 2019-04-21 NOTE — ED Provider Notes (Signed)
[facility]    CSN: [id number] Arrival date & time: 04/21/19  1001      History   Chief Complaint No chief complaint on file.   HPI Marco Stevenson is a 64 y.o. male.   Who presents to the urgent care with a  complaint of fatigue and weakness for the past 3 days.  Denies any precipitating event.  Was advised to come to the urgent care by gastroenterology to have labs completed.  Was diagnosed with diverticulitis and is currently using amoxicillin for treatment.  Denies chills, fever, nausea, vomiting, diarrhea, constipation, abdominal pain.     Past Medical History:  Diagnosis Date  . Acid reflux   . Hypertension     Patient Active Problem List   Diagnosis Date Noted  . Enteritis 04/10/2019  . Syncopal episodes 05/12/2018  . UGI bleed 05/11/2018  . Hypertension 05/11/2018  . ETOH abuse 05/11/2018  . Acid reflux 05/11/2018  . Hypomagnesemia 05/11/2018  . Acute blood loss anemia 05/11/2018    Past Surgical History:  Procedure Laterality Date  . BIOPSY  05/14/2018   Procedure: BIOPSY;  Surgeon: Clarene Essex, MD;  Location: [facility];  Service: Endoscopy;;  . ESOPHAGOGASTRODUODENOSCOPY (EGD) WITH PROPOFOL N/A 05/12/2018   Procedure: ESOPHAGOGASTRODUODENOSCOPY (EGD) WITH PROPOFOL;  Surgeon: Danie Binder, MD;  [facility];  Service: Endoscopy;  Laterality: N/A;  . ESOPHAGOGASTRODUODENOSCOPY (EGD) WITH PROPOFOL N/A 05/14/2018   Procedure: ESOPHAGOGASTRODUODENOSCOPY (EGD) WITH PROPOFOL;  Surgeon: Clarene Essex, MD;  Location: [facility];  Service: Endoscopy;  Laterality: N/A;  start with side-viewing scope  . ESOPHAGOGASTRODUODENOSCOPY (EGD) WITH PROPOFOL N/A 09/23/2018   Procedure: ESOPHAGOGASTRODUODENOSCOPY (EGD) WITH PROPOFOL;  Surgeon: Arta Silence, MD;  Location: [facility];  Service: Endoscopy;  Laterality: N/A;  endo loop  . EUS N/A 09/23/2018   Procedure: UPPER ENDOSCOPIC ULTRASOUND (EUS) RADIAL;  Surgeon: Arta Silence, MD;  Location:  [facility];  Service: Endoscopy;  Laterality: N/A;  . HERNIA REPAIR    . POLYPECTOMY  05/12/2018   Procedure: POLYPECTOMY;  Surgeon: Danie Binder, MD;  [facility];  Service: Endoscopy;;  polyp gastric  . POLYPECTOMY  09/23/2018   Procedure: POLYPECTOMY;  Surgeon: Arta Silence, MD;  Location[facility];  Service: Endoscopy;;  . SUBMUCOSAL INJECTION  09/23/2018   Procedure: SUBMUCOSAL INJECTION;  Surgeon: Arta Silence, MD;  Location: [facility];  Service: Endoscopy;;       Home Medications    Prior to Admission medications   Medication Sig Start Date End Date Taking? Authorizing Provider  acetaminophen (TYLENOL) 325 MG tablet Take 2 tablets (650 mg total) by mouth every 6 (six) hours as needed for mild pain or moderate pain (Call Dr. Perley Jain office if you need more pain medicine). 04/12/19   Earnstine Regal, PA-C  ALPRAZolam Duanne Moron) 1 MG tablet Take 0.5 mg by mouth at bedtime as needed for sleep.  03/13/18   [provider]  amoxicillin-clavulanate (AUGMENTIN) 875-125 MG tablet Take 1 tablet by mouth every 12 (twelve) hours. 04/12/19   Earnstine Regal, PA-C  losartan (COZAAR) 100 MG tablet Take 100 mg by mouth daily.  04/08/18   [provider]  Multiple Vitamin (MULTIVITAMIN WITH MINERALS) TABS tablet Take 1 tablet by mouth daily. 05/15/18   Aline August, MD  pantoprazole (PROTONIX) 40 MG tablet Take 1 tablet (40 mg total) by mouth daily. 05/15/18   Aline August, MD  saccharomyces boulardii (FLORASTOR) 250 MG capsule This is a probiotic.  You can buy it over-the-counter  at any drugstore.  You pick 1 up and follow package directions for the next 30 days. 04/12/19   Sherrie George, PA-C  sildenafil (VIAGRA) 25 MG tablet Take 25 mg by mouth as needed for erectile dysfunction.     [provider]  Tetrahydrozoline HCl (VISINE OP) Place 1 drop into both eyes daily as needed (dry eyes).    [provider]    Family History Family  History  Problem Relation Age of Onset  . Hypertension Father   . Colon cancer Neg Hx   . Colon polyps Neg Hx     Social History Social History   Tobacco Use  . Smoking status: Never Smoker  . Smokeless tobacco: Never Used  Substance Use Topics  . Alcohol use: Yes    Comment: 4-5 shots of tequila for past 6+ months, occasional beer   . Drug use: No     Allergies   Patient has no known allergies.   Review of Systems Review of Systems  Constitutional: Positive for fatigue.  Respiratory: Negative.   Cardiovascular: Negative.   Gastrointestinal: Negative.  Negative for abdominal pain, blood in stool, constipation and diarrhea.  All other systems reviewed and are negative.    Physical Exam Triage Vital Signs ED Triage Vitals  Enc Vitals Group     BP      Pulse      Resp      Temp      Temp src      SpO2      Weight      Height      Head Circumference      Peak Flow      Pain Score      Pain Loc      Pain Edu?      Excl. in GC?    No data found.  Updated Vital Signs There were no vitals taken for this visit.  Visual Acuity Right Eye Distance:   Left Eye Distance:   Bilateral Distance:    Right Eye Near:   Left Eye Near:    Bilateral Near:     Physical Exam Vitals and nursing note reviewed.  Constitutional:      General: He is not in acute distress.    Appearance: Normal appearance. He is normal weight. He is not ill-appearing or toxic-appearing.  HENT:     Head: Normocephalic.     Right Ear: Tympanic membrane, ear canal and external ear normal. There is no impacted cerumen.     Left Ear: Tympanic membrane, ear canal and external ear normal. There is no impacted cerumen.  Cardiovascular:     Rate and Rhythm: Normal rate and regular rhythm.     Pulses: Normal pulses.     Heart sounds: Normal heart sounds. No murmur. No gallop.   Pulmonary:     Effort: Pulmonary effort is normal. No respiratory distress.     Breath sounds: Normal breath sounds.  No stridor. No wheezing, rhonchi or rales.  Chest:     Chest wall: No tenderness.  Abdominal:     General: Abdomen is flat. Bowel sounds are normal. There is distension.     Palpations: There is no mass.     Tenderness: There is no abdominal tenderness. There is no right CVA tenderness, left CVA tenderness, guarding or rebound.     Hernia: No hernia is present.  Neurological:     Mental Status: He is alert.      UC  Treatments / Results  Labs (all labs ordered are listed, but only abnormal results are displayed) Labs Reviewed  CBC WITH DIFFERENTIAL/PLATELET  BASIC METABOLIC PANEL  LIPASE    EKG   Radiology No results found.  Procedures Procedures (including critical care time)  Medications Ordered in UC Medications - No data to display  Initial Impression / Assessment and Plan / UC Course  I have reviewed the triage vital signs and the nursing notes.  Pertinent labs & imaging results that were available during my care of the patient were reviewed by me and considered in my medical decision making (see chart for details).   Patient is stable at discharge. Advised patient to follow-up with GI Advised to complete amoxicillin course Eat well-balanced diet rich in fiber Go to ED for worsening of symptoms   Final Clinical Impressions(s) / UC Diagnoses   Final diagnoses:  Diverticulitis     Discharge Instructions     Patient was advised to follow-up with GI Advised to complete antibiotic as prescribed and to completion Continue to take probiotic Eat a well-balanced diet rich in fiber  Exercise daily Go to ED for worsening of symptoms such as bloody ,black , and tarry stool, fever or if your symptoms are worse    ED Prescriptions    None     PDMP not reviewed this encounter.   Durward Parcel, FNP 04/21/19 1058

## 2019-04-21 NOTE — Discharge Instructions (Addendum)
Patient was advised to follow-up with GI Advised to complete antibiotic as prescribed and to completion Continue to take probiotic Eat a well-balanced diet rich in fiber  Exercise daily Go to ED for worsening of symptoms such as bloody ,black , and tarry stool, fever or if your symptoms are worse

## 2019-04-30 ENCOUNTER — Other Ambulatory Visit: Payer: Self-pay | Admitting: Gastroenterology

## 2019-04-30 DIAGNOSIS — R933 Abnormal findings on diagnostic imaging of other parts of digestive tract: Secondary | ICD-10-CM

## 2019-04-30 DIAGNOSIS — R1084 Generalized abdominal pain: Secondary | ICD-10-CM

## 2019-05-14 ENCOUNTER — Ambulatory Visit
Admission: RE | Admit: 2019-05-14 | Discharge: 2019-05-14 | Disposition: A | Discharge status: Home / Self Care | Payer: Commercial Managed Care - PPO | Source: Ambulatory Visit | Attending: Gastroenterology | Admitting: Gastroenterology

## 2019-05-14 DIAGNOSIS — R933 Abnormal findings on diagnostic imaging of other parts of digestive tract: Secondary | ICD-10-CM

## 2019-05-14 DIAGNOSIS — R1084 Generalized abdominal pain: Secondary | ICD-10-CM

## 2019-05-14 MED ORDER — IOPAMIDOL (ISOVUE-300) INJECTION 61%
100.0000 mL | Freq: Once | INTRAVENOUS | Status: AC | PRN
  Administered 2019-05-14: 100 mL via INTRAVENOUS

## 2019-06-04 ENCOUNTER — Other Ambulatory Visit: Payer: Self-pay | Admitting: Gastroenterology

## 2019-06-04 DIAGNOSIS — R9389 Abnormal findings on diagnostic imaging of other specified body structures: Secondary | ICD-10-CM

## 2019-06-18 ENCOUNTER — Other Ambulatory Visit: Payer: Self-pay

## 2019-06-18 ENCOUNTER — Ambulatory Visit
Admission: RE | Admit: 2019-06-18 | Discharge: 2019-06-18 | Disposition: A | Discharge status: Home / Self Care | Payer: Commercial Managed Care - PPO | Source: Ambulatory Visit | Attending: Gastroenterology | Admitting: Gastroenterology

## 2019-06-18 DIAGNOSIS — R9389 Abnormal findings on diagnostic imaging of other specified body structures: Secondary | ICD-10-CM

## 2019-06-18 MED ORDER — GADOBENATE DIMEGLUMINE 529 MG/ML IV SOLN
19.0000 mL | Freq: Once | INTRAVENOUS | Status: AC | PRN
  Administered 2019-06-18: 19 mL via INTRAVENOUS

## 2019-08-10 ENCOUNTER — Other Ambulatory Visit: Payer: Self-pay | Admitting: Urology

## 2019-09-16 NOTE — Patient Instructions (Addendum)
DUE TO COVID-19 ONLY ONE VISITOR IS ALLOWED TO COME WITH YOU AND STAY IN THE WAITING ROOM ONLY DURING PRE OP AND PROCEDURE DAY OF SURGERY. THE 1 VISITOR MAY VISIT WITH YOU AFTER SURGERY IN YOUR PRIVATE ROOM DURING VISITING HOURS ONLY!  YOU NEED TO HAVE A COVID 19 TEST ON: 09/21/19 @ 10:00 am , THIS TEST MUST BE DONE BEFORE SURGERY, COME TO  COVID TESTING SITE [address] [place] [address], IT IS ON THE RIGHT GOING OUT [address] 2 MINUTES PAST [employer] ON THE RIGHT. ONCE YOUR COVID TEST IS COMPLETED,  PLEASE BEGIN THE QUARANTINE INSTRUCTIONS AS OUTLINED IN YOUR HANDOUT.                Marco Stevenson    Your procedure is scheduled on: 09/24/19   Report to [facility] Main  Entrance   Report to admitting at: 10:30AM     Call this number if you have problems the morning of surgery [phone]    Remember: Do not eat solid food :After Midnight. Clear liquids from midnight until: 9:30 am   CLEAR LIQUID DIET   Foods Allowed                                                                     Foods Excluded  Coffee and tea, regular and decaf                             liquids that you cannot  Plain Jell-O any favor except red or purple                                           see through such as: Fruit ices (not with fruit pulp)                                     milk, soups, orange juice  Iced Popsicles                                    All solid food Carbonated beverages, regular and diet                                    Cranberry, grape and apple juices Sports drinks like Gatorade Lightly seasoned clear broth or consume(fat free) Sugar, honey syrup  Sample Menu Breakfast                                Lunch                                     Supper Cranberry juice  Beef broth                            Chicken broth Jell-O                                     Grape juice                           Apple  juice Coffee or tea                        Jell-O                                      Popsicle                                                Coffee or tea                        Coffee or tea  _____________________________________________________________________  BRUSH YOUR TEETH MORNING OF SURGERY AND RINSE YOUR MOUTH OUT, NO CHEWING GUM CANDY OR MINTS.     Take these medicines the morning of surgery with A SIP OF WATER: pantoprazole.                                You may not have any metal on your body including hair pins and              piercings  Do not wear jewelry, make-up, lotions, powders or perfumes, deodorant            Men may shave face and neck.   Do not bring valuables to the hospital. [facility] IS NOT             RESPONSIBLE   FOR VALUABLES.  Contacts, dentures or bridgework may not be worn into surgery.  Leave suitcase in the car. After surgery it may be brought to your room.     Patients discharged the day of surgery will not be allowed to drive home. IF YOU ARE HAVING SURGERY AND GOING HOME THE SAME DAY, YOU MUST HAVE AN ADULT TO DRIVE YOU HOME AND BE WITH YOU FOR 24 HOURS. YOU MAY GO HOME BY TAXI OR UBER OR ORTHERWISE, BUT AN ADULT MUST ACCOMPANY YOU HOME AND STAY WITH YOU FOR 24 HOURS.  Name and phone number of your driver:  Special Instructions: N/A              Please read over the following fact sheets you were given: _____________________________________________________________________        [facility] - Preparing for Surgery Before surgery, you can play an important role.  Because skin is not sterile, your skin needs to be as free of germs as possible.  You can reduce the number of germs on your skin by washing with CHG (chlorahexidine gluconate) soap before surgery.  CHG is an antiseptic cleaner which kills germs and bonds with the skin to continue killing germs even  after washing. Please DO NOT use if you have an allergy to CHG or antibacterial soaps.   If your skin becomes reddened/irritated stop using the CHG and inform your nurse when you arrive at Short Stay. Do not shave (including legs and underarms) for at least 48 hours prior to the first CHG shower.  You may shave your face/neck. Please follow these instructions carefully:  1.  Shower with CHG Soap the night before surgery and the  morning of Surgery.  2.  If you choose to wash your hair, wash your hair first as usual with your  normal  shampoo.  3.  After you shampoo, rinse your hair and body thoroughly to remove the  shampoo.                           4.  Use CHG as you would any other liquid soap.  You can apply chg directly  to the skin and wash                       Gently with a scrungie or clean washcloth.  5.  Apply the CHG Soap to your body ONLY FROM THE NECK DOWN.   Do not use on face/ open                           Wound or open sores. Avoid contact with eyes, ears mouth and genitals (private parts).                       Wash face,  Genitals (private parts) with your normal soap.             6.  Wash thoroughly, paying special attention to the area where your surgery  will be performed.  7.  Thoroughly rinse your body with warm water from the neck down.  8.  DO NOT shower/wash with your normal soap after using and rinsing off  the CHG Soap.                9.  Pat yourself dry with a clean towel.            10.  Wear clean pajamas.            11.  Place clean sheets on your bed the night of your first shower and do not  sleep with pets. Day of Surgery : Do not apply any lotions/deodorants the morning of surgery.  Please wear clean clothes to the hospital/surgery center.  FAILURE TO FOLLOW THESE INSTRUCTIONS MAY RESULT IN THE CANCELLATION OF YOUR SURGERY PATIENT SIGNATURE_________________________________  NURSE SIGNATURE__________________________________  ________________________________________________________________________   Marco Stevenson  An incentive  spirometer is a tool that can help keep your lungs clear and active. This tool measures how well you are filling your lungs with each breath. Taking long deep breaths may help reverse or decrease the chance of developing breathing (pulmonary) problems (especially infection) following:  A long period of time when you are unable to move or be active. BEFORE THE PROCEDURE   If the spirometer includes an indicator to show your best effort, your nurse or respiratory therapist will set it to a desired goal.  If possible, sit up straight or lean slightly forward. Try not to slouch.  Hold the incentive spirometer in an upright position. INSTRUCTIONS FOR USE  1. Sit on the edge  of your bed if possible, or sit up as far as you can in bed or on a chair. 2. Hold the incentive spirometer in an upright position. 3. Breathe out normally. 4. Place the mouthpiece in your mouth and seal your lips tightly around it. 5. Breathe in slowly and as deeply as possible, raising the piston or the ball toward the top of the column. 6. Hold your breath for 3-5 seconds or for as long as possible. Allow the piston or ball to fall to the bottom of the column. 7. Remove the mouthpiece from your mouth and breathe out normally. 8. Rest for a few seconds and repeat Steps 1 through 7 at least 10 times every 1-2 hours when you are awake. Take your time and take a few normal breaths between deep breaths. 9. The spirometer may include an indicator to show your best effort. Use the indicator as a goal to work toward during each repetition. 10. After each set of 10 deep breaths, practice coughing to be sure your lungs are clear. If you have an incision (the cut made at the time of surgery), support your incision when coughing by placing a pillow or rolled up towels firmly against it. Once you are able to get out of bed, walk around indoors and cough well. You may stop using the incentive spirometer when instructed by your caregiver.   RISKS AND COMPLICATIONS  Take your time so you do not get dizzy or light-headed.  If you are in pain, you may need to take or ask for pain medication before doing incentive spirometry. It is harder to take a deep breath if you are having pain. AFTER USE  Rest and breathe slowly and easily.  It can be helpful to keep track of a log of your progress. Your caregiver can provide you with a simple table to help with this. If you are using the spirometer at home, follow these instructions: [place] IF:   You are having difficultly using the spirometer.  You have trouble using the spirometer as often as instructed.  Your pain medication is not giving enough relief while using the spirometer.  You develop fever of 100.5 F (38.1 C) or higher. SEEK IMMEDIATE MEDICAL CARE IF:   You cough up bloody sputum that had not been present before.  You develop fever of 102 F (38.9 C) or greater.  You develop worsening pain at or near the incision site. MAKE SURE YOU:   Understand these instructions.  Will watch your condition.  Will get help right away if you are not doing well or get worse. Document Released: 06/17/2006 Document Revised: 04/29/2011 Document Reviewed: 08/18/2006 [facility] Patient Information 2014 [place], [place].   ________________________________________________________________________

## 2019-09-17 ENCOUNTER — Other Ambulatory Visit: Payer: Self-pay

## 2019-09-17 ENCOUNTER — Encounter (HOSPITAL_COMMUNITY)
Admission: RE | Admit: 2019-09-17 | Discharge: 2019-09-17 | Disposition: A | Discharge status: Home / Self Care | Payer: Commercial Managed Care - PPO | Source: Ambulatory Visit | Attending: Urology | Admitting: Urology

## 2019-09-17 ENCOUNTER — Encounter (HOSPITAL_COMMUNITY): Payer: Self-pay

## 2019-09-17 DIAGNOSIS — Z01812 Encounter for preprocedural laboratory examination: Secondary | ICD-10-CM | POA: Insufficient documentation

## 2019-09-17 HISTORY — DX: Depression, unspecified: F32.A

## 2019-09-17 HISTORY — DX: Anxiety disorder, unspecified: F41.9

## 2019-09-17 LAB — CBC
HCT: 41.6 % (ref 39.0–52.0)
Hemoglobin: 14.5 g/dL (ref 13.0–17.0)
MCH: 32.4 pg (ref 26.0–34.0)
MCHC: 34.9 g/dL (ref 30.0–36.0)
MCV: 93.1 fL (ref 80.0–100.0)
Platelets: 190 10*3/uL (ref 150–400)
RBC: 4.47 MIL/uL (ref 4.22–5.81)
RDW: 13.1 % (ref 11.5–15.5)
WBC: 5.8 10*3/uL (ref 4.0–10.5)
nRBC: 0 % (ref 0.0–0.2)

## 2019-09-17 LAB — COMPREHENSIVE METABOLIC PANEL
ALT: 61 U/L — ABNORMAL HIGH (ref 0–44)
AST: 72 U/L — ABNORMAL HIGH (ref 15–41)
Albumin: 3.7 g/dL (ref 3.5–5.0)
Alkaline Phosphatase: 53 U/L (ref 38–126)
Anion gap: 13 (ref 5–15)
BUN: 7 mg/dL — ABNORMAL LOW (ref 8–23)
CO2: 25 mmol/L (ref 22–32)
Calcium: 8.5 mg/dL — ABNORMAL LOW (ref 8.9–10.3)
Chloride: 105 mmol/L (ref 98–111)
Creatinine, Ser: 0.82 mg/dL (ref 0.61–1.24)
GFR calc Af Amer: >60 mL/min (ref >60)
GFR calc non Af Amer: >60 mL/min (ref >60)
Glucose, Bld: 86 mg/dL (ref 70–99)
Potassium: 3.7 mmol/L (ref 3.5–5.1)
Sodium: 143 mmol/L (ref 135–145)
Total Bilirubin: 0.9 mg/dL (ref 0.3–1.2)
Total Protein: 6.4 g/dL — ABNORMAL LOW (ref 6.5–8.1)

## 2019-09-17 LAB — PROTIME-INR
INR: 0.9 (ref 0.8–1.2)
Prothrombin Time: 11.9 seconds (ref 11.4–15.2)

## 2019-09-17 NOTE — Progress Notes (Signed)
COVID Vaccine Completed:No Date COVID Vaccine completed: COVID vaccine manufacturer: [employer]   PCP - Dr. Gareth Morgan. LOV: 08/2019 Cardiologist - No  Chest x-ray -  EKG -  Stress Test -  ECHO -  Cardiac Cath -   Sleep Study -  CPAP -   Fasting Blood Sugar -  Checks Blood Sugar _____ times a day  Blood Thinner Instructions: Aspirin Instructions: Last Dose:  Anesthesia review:   Patient denies shortness of breath, fever, cough and chest pain at PAT appointment   Patient verbalized understanding of instructions that were given to them at the PAT appointment. Patient was also instructed that they will need to review over the PAT instructions again at home before surgery.

## 2019-09-21 ENCOUNTER — Other Ambulatory Visit (HOSPITAL_COMMUNITY)
Admission: RE | Admit: 2019-09-21 | Discharge: 2019-09-21 | Disposition: A | Discharge status: Home / Self Care | Payer: Commercial Managed Care - PPO | Source: Ambulatory Visit | Attending: Urology | Admitting: Urology

## 2019-09-21 DIAGNOSIS — Z01812 Encounter for preprocedural laboratory examination: Secondary | ICD-10-CM | POA: Diagnosis not present

## 2019-09-21 DIAGNOSIS — Z20822 Contact with and (suspected) exposure to covid-19: Secondary | ICD-10-CM | POA: Diagnosis not present

## 2019-09-21 LAB — SARS CORONAVIRUS 2 (TAT 6-24 HRS): SARS Coronavirus 2: NEGATIVE

## 2019-09-23 NOTE — Anesthesia Preprocedure Evaluation (Addendum)
Anesthesia Evaluation  Patient identified by MRN, date of birth, ID band Patient awake    Reviewed: Allergy & Precautions, NPO status , Patient's Chart, lab work & pertinent test results  History of Anesthesia Complications Negative for: history of anesthetic complications  Airway Mallampati: II  TM Distance: >3 FB Neck ROM: Full    Dental no notable dental hx.    Pulmonary neg pulmonary ROS,    Pulmonary exam normal        Cardiovascular hypertension, Pt. on medications Normal cardiovascular exam     Neuro/Psych Anxiety Depression negative neurological ROS     GI/Hepatic Neg liver ROS, GERD  Medicated and Controlled,  Endo/Other  negative endocrine ROS  Renal/GU Right renal mass  negative genitourinary   Musculoskeletal negative musculoskeletal ROS (+)   Abdominal   Peds  Hematology negative hematology ROS (+)   Anesthesia Other Findings Day of surgery medications reviewed with patient.  Reproductive/Obstetrics negative OB ROS                            Anesthesia Physical Anesthesia Plan  ASA: III  Anesthesia Plan: General   Post-op Pain Management:    Induction: Intravenous  PONV Risk Score and Plan: 4 or greater and Treatment may vary due to age or medical condition, Ondansetron, Dexamethasone and Midazolam  Airway Management Planned: Oral ETT  Additional Equipment:   Intra-op Plan:   Post-operative Plan: Extubation in OR  Informed Consent:   Plan Discussed with:   Anesthesia Plan Comments:         Anesthesia Quick Evaluation

## 2019-09-24 ENCOUNTER — Observation Stay (HOSPITAL_COMMUNITY)
Admission: RE | Admit: 2019-09-24 | Discharge: 2019-09-25 | Disposition: A | Discharge status: Home / Self Care | Payer: Commercial Managed Care - PPO | Attending: Urology | Admitting: Urology

## 2019-09-24 ENCOUNTER — Other Ambulatory Visit: Payer: Self-pay

## 2019-09-24 ENCOUNTER — Ambulatory Visit (HOSPITAL_COMMUNITY): Payer: Commercial Managed Care - PPO | Admitting: Anesthesiology

## 2019-09-24 ENCOUNTER — Encounter (HOSPITAL_COMMUNITY): Payer: Self-pay | Admitting: Urology

## 2019-09-24 ENCOUNTER — Encounter (HOSPITAL_COMMUNITY)
Admission: RE | Disposition: A | Discharge status: Home / Self Care | Payer: Self-pay | Source: Home / Self Care | Attending: Urology

## 2019-09-24 DIAGNOSIS — Z20822 Contact with and (suspected) exposure to covid-19: Secondary | ICD-10-CM | POA: Diagnosis not present

## 2019-09-24 DIAGNOSIS — N2889 Other specified disorders of kidney and ureter: Principal | ICD-10-CM | POA: Insufficient documentation

## 2019-09-24 DIAGNOSIS — I10 Essential (primary) hypertension: Secondary | ICD-10-CM | POA: Diagnosis not present

## 2019-09-24 HISTORY — PX: ROBOTIC ASSITED PARTIAL NEPHRECTOMY: SHX6087

## 2019-09-24 LAB — HEMOGLOBIN AND HEMATOCRIT, BLOOD
HCT: 42.7 % (ref 39.0–52.0)
Hemoglobin: 14.9 g/dL (ref 13.0–17.0)

## 2019-09-24 LAB — TYPE AND SCREEN
ABO/RH(D): B POS
Antibody Screen: NEGATIVE

## 2019-09-24 SURGERY — NEPHRECTOMY, PARTIAL, ROBOT-ASSISTED
Anesthesia: General | Laterality: Right

## 2019-09-24 MED ORDER — BUPIVACAINE LIPOSOME 1.3 % IJ SUSP
20.0000 mL | Freq: Once | INTRAMUSCULAR | Status: AC
  Administered 2019-09-24: 20 mL
  Filled 2019-09-24: qty 20

## 2019-09-24 MED ORDER — ONDANSETRON HCL 4 MG/2ML IJ SOLN: 4.0000 mg | INTRAMUSCULAR | Status: DC | PRN

## 2019-09-24 MED ORDER — MIDAZOLAM HCL 2 MG/2ML IJ SOLN
INTRAMUSCULAR | Status: AC
  Filled 2019-09-24: qty 2

## 2019-09-24 MED ORDER — CHLORHEXIDINE GLUCONATE 0.12 % MT SOLN
15.0000 mL | Freq: Once | OROMUCOSAL | Status: AC
  Administered 2019-09-24: 15 mL via OROMUCOSAL

## 2019-09-24 MED ORDER — DIPHENHYDRAMINE HCL 50 MG/ML IJ SOLN: 12.5000 mg | Freq: Four times a day (QID) | INTRAMUSCULAR | Status: DC | PRN

## 2019-09-24 MED ORDER — FENTANYL CITRATE (PF) 100 MCG/2ML IJ SOLN
INTRAMUSCULAR | Status: AC
  Filled 2019-09-24: qty 2

## 2019-09-24 MED ORDER — MIDAZOLAM HCL 5 MG/5ML IJ SOLN
INTRAMUSCULAR | Status: DC | PRN
  Administered 2019-09-24: 2 mg via INTRAVENOUS

## 2019-09-24 MED ORDER — MENTHOL 3 MG MT LOZG: 1.0000 | LOZENGE | OROMUCOSAL | Status: DC | PRN

## 2019-09-24 MED ORDER — DEXAMETHASONE SODIUM PHOSPHATE 10 MG/ML IJ SOLN
INTRAMUSCULAR | Status: AC
  Filled 2019-09-24: qty 1

## 2019-09-24 MED ORDER — DOCUSATE SODIUM 100 MG PO CAPS
100.0000 mg | ORAL_CAPSULE | Freq: Two times a day (BID) | ORAL | Status: DC
  Administered 2019-09-24 – 2019-09-25 (×2): 100 mg via ORAL
  Filled 2019-09-24: qty 1

## 2019-09-24 MED ORDER — BUPIVACAINE-EPINEPHRINE 0.25% -1:200000 IJ SOLN
INTRAMUSCULAR | Status: DC | PRN
  Administered 2019-09-24: 30 mL

## 2019-09-24 MED ORDER — PROMETHAZINE HCL 12.5 MG PO TABS: 12.5000 mg | ORAL_TABLET | ORAL | 0 refills | Status: AC | PRN

## 2019-09-24 MED ORDER — PROMETHAZINE HCL 25 MG/ML IJ SOLN: 6.2500 mg | INTRAMUSCULAR | Status: DC | PRN

## 2019-09-24 MED ORDER — LOSARTAN POTASSIUM 50 MG PO TABS
100.0000 mg | ORAL_TABLET | Freq: Every day | ORAL | Status: DC
  Administered 2019-09-25: 100 mg via ORAL
  Filled 2019-09-24: qty 2

## 2019-09-24 MED ORDER — PHENOL 1.4 % MT LIQD: 1.0000 | OROMUCOSAL | Status: DC | PRN

## 2019-09-24 MED ORDER — LACTATED RINGERS IV SOLN: INTRAVENOUS | Status: DC

## 2019-09-24 MED ORDER — PROPOFOL 10 MG/ML IV BOLUS
INTRAVENOUS | Status: DC | PRN
  Administered 2019-09-24: 180 mg via INTRAVENOUS
  Administered 2019-09-24: 20 mg via INTRAVENOUS

## 2019-09-24 MED ORDER — PHENYLEPHRINE 40 MCG/ML (10ML) SYRINGE FOR IV PUSH (FOR BLOOD PRESSURE SUPPORT)
PREFILLED_SYRINGE | INTRAVENOUS | Status: AC
  Filled 2019-09-24: qty 10

## 2019-09-24 MED ORDER — ROCURONIUM BROMIDE 10 MG/ML (PF) SYRINGE
PREFILLED_SYRINGE | INTRAVENOUS | Status: AC
  Filled 2019-09-24: qty 10

## 2019-09-24 MED ORDER — LIDOCAINE 2% (20 MG/ML) 5 ML SYRINGE
INTRAMUSCULAR | Status: AC
  Filled 2019-09-24: qty 5

## 2019-09-24 MED ORDER — DEXTROSE-NACL 5-0.45 % IV SOLN: INTRAVENOUS | Status: DC

## 2019-09-24 MED ORDER — "VISTASEAL 4 ML SINGLE DOSE KIT "
4.0000 mL | PACK | Freq: Once | CUTANEOUS | Status: AC
  Administered 2019-09-24: 4 mL via TOPICAL
  Filled 2019-09-24: qty 4

## 2019-09-24 MED ORDER — FENTANYL CITRATE (PF) 100 MCG/2ML IJ SOLN
25.0000 ug | INTRAMUSCULAR | Status: DC | PRN
  Administered 2019-09-24 (×2): 50 ug via INTRAVENOUS

## 2019-09-24 MED ORDER — LACTATED RINGERS IR SOLN
Status: DC | PRN
  Administered 2019-09-24: 1

## 2019-09-24 MED ORDER — BELLADONNA ALKALOIDS-OPIUM 16.2-60 MG RE SUPP: 1.0000 | Freq: Four times a day (QID) | RECTAL | Status: DC | PRN

## 2019-09-24 MED ORDER — LIDOCAINE HCL 2 % IJ SOLN
INTRAMUSCULAR | Status: AC
  Filled 2019-09-24: qty 20

## 2019-09-24 MED ORDER — LIDOCAINE 2% (20 MG/ML) 5 ML SYRINGE
INTRAMUSCULAR | Status: DC | PRN
  Administered 2019-09-24: 100 mg via INTRAVENOUS

## 2019-09-24 MED ORDER — FENTANYL CITRATE (PF) 100 MCG/2ML IJ SOLN
INTRAMUSCULAR | Status: DC | PRN
  Administered 2019-09-24: 100 ug via INTRAVENOUS
  Administered 2019-09-24: 50 ug via INTRAVENOUS

## 2019-09-24 MED ORDER — OXYCODONE HCL 5 MG/5ML PO SOLN: 5.0000 mg | Freq: Once | ORAL | Status: DC | PRN

## 2019-09-24 MED ORDER — HYDROMORPHONE HCL 1 MG/ML IJ SOLN: 0.5000 mg | INTRAMUSCULAR | Status: DC | PRN

## 2019-09-24 MED ORDER — STERILE WATER FOR IRRIGATION IR SOLN
Status: DC | PRN
  Administered 2019-09-24: 1000 mL

## 2019-09-24 MED ORDER — EPHEDRINE 5 MG/ML INJ
INTRAVENOUS | Status: AC
  Filled 2019-09-24: qty 10

## 2019-09-24 MED ORDER — ACETAMINOPHEN 500 MG PO TABS
1000.0000 mg | ORAL_TABLET | Freq: Once | ORAL | Status: AC
  Administered 2019-09-24: 1000 mg via ORAL
  Filled 2019-09-24: qty 2

## 2019-09-24 MED ORDER — KETAMINE HCL 10 MG/ML IJ SOLN
INTRAMUSCULAR | Status: DC | PRN
  Administered 2019-09-24 (×2): 40 mg via INTRAVENOUS

## 2019-09-24 MED ORDER — OXYCODONE HCL 5 MG PO TABS
5.0000 mg | ORAL_TABLET | ORAL | Status: DC | PRN
  Administered 2019-09-25: 5 mg via ORAL
  Filled 2019-09-24: qty 1

## 2019-09-24 MED ORDER — PROPOFOL 10 MG/ML IV BOLUS
INTRAVENOUS | Status: AC
  Filled 2019-09-24: qty 20

## 2019-09-24 MED ORDER — DOCUSATE SODIUM 100 MG PO CAPS: 100.0000 mg | ORAL_CAPSULE | Freq: Two times a day (BID) | ORAL | Status: AC

## 2019-09-24 MED ORDER — CEFAZOLIN SODIUM-DEXTROSE 2-4 GM/100ML-% IV SOLN
2.0000 g | Freq: Once | INTRAVENOUS | Status: AC
  Administered 2019-09-24: 2 g via INTRAVENOUS
  Filled 2019-09-24: qty 100

## 2019-09-24 MED ORDER — HYDROCODONE-ACETAMINOPHEN 5-325 MG PO TABS: 1.0000 | ORAL_TABLET | Freq: Four times a day (QID) | ORAL | 0 refills | Status: AC | PRN

## 2019-09-24 MED ORDER — ALPRAZOLAM 0.5 MG PO TABS
0.5000 mg | ORAL_TABLET | Freq: Every evening | ORAL | Status: DC | PRN
  Administered 2019-09-24: 0.5 mg via ORAL
  Filled 2019-09-24: qty 1

## 2019-09-24 MED ORDER — ONDANSETRON HCL 4 MG/2ML IJ SOLN
INTRAMUSCULAR | Status: AC
  Filled 2019-09-24: qty 2

## 2019-09-24 MED ORDER — CHLORHEXIDINE GLUCONATE CLOTH 2 % EX PADS
6.0000 | MEDICATED_PAD | Freq: Every day | CUTANEOUS | Status: DC
  Administered 2019-09-25: 6 via TOPICAL

## 2019-09-24 MED ORDER — ORAL CARE MOUTH RINSE: 15.0000 mL | Freq: Once | OROMUCOSAL | Status: AC

## 2019-09-24 MED ORDER — OXYCODONE HCL 5 MG PO TABS: 5.0000 mg | ORAL_TABLET | Freq: Once | ORAL | Status: DC | PRN

## 2019-09-24 MED ORDER — DIPHENHYDRAMINE HCL 12.5 MG/5ML PO ELIX
12.5000 mg | ORAL_SOLUTION | Freq: Four times a day (QID) | ORAL | Status: DC | PRN
  Administered 2019-09-24: 25 mg via ORAL
  Filled 2019-09-24: qty 10

## 2019-09-24 MED ORDER — PANTOPRAZOLE SODIUM 40 MG PO TBEC
40.0000 mg | DELAYED_RELEASE_TABLET | Freq: Every day | ORAL | Status: DC
  Administered 2019-09-25: 40 mg via ORAL
  Filled 2019-09-24: qty 1

## 2019-09-24 MED ORDER — ORAL CARE MOUTH RINSE
15.0000 mL | Freq: Two times a day (BID) | OROMUCOSAL | Status: DC
  Administered 2019-09-24: 15 mL via OROMUCOSAL

## 2019-09-24 MED ORDER — ONDANSETRON HCL 4 MG/2ML IJ SOLN
INTRAMUSCULAR | Status: DC | PRN
  Administered 2019-09-24: 4 mg via INTRAVENOUS

## 2019-09-24 MED ORDER — BACITRACIN-NEOMYCIN-POLYMYXIN 400-5-5000 EX OINT: 1.0000 "application " | TOPICAL_OINTMENT | Freq: Three times a day (TID) | CUTANEOUS | Status: DC | PRN

## 2019-09-24 MED ORDER — BUPIVACAINE-EPINEPHRINE 0.25% -1:200000 IJ SOLN
INTRAMUSCULAR | Status: AC
  Filled 2019-09-24: qty 1

## 2019-09-24 MED ORDER — KETAMINE HCL 10 MG/ML IJ SOLN
INTRAMUSCULAR | Status: AC
  Filled 2019-09-24: qty 1

## 2019-09-24 MED ORDER — ACETAMINOPHEN 325 MG PO TABS: 650.0000 mg | ORAL_TABLET | ORAL | Status: DC | PRN

## 2019-09-24 MED ORDER — ROCURONIUM BROMIDE 10 MG/ML (PF) SYRINGE
PREFILLED_SYRINGE | INTRAVENOUS | Status: DC | PRN
  Administered 2019-09-24 (×2): 20 mg via INTRAVENOUS
  Administered 2019-09-24: 60 mg via INTRAVENOUS
  Administered 2019-09-24: 20 mg via INTRAVENOUS

## 2019-09-24 MED ORDER — DEXAMETHASONE SODIUM PHOSPHATE 10 MG/ML IJ SOLN
INTRAMUSCULAR | Status: DC | PRN
  Administered 2019-09-24: 10 mg via INTRAVENOUS

## 2019-09-24 SURGICAL SUPPLY — 67 items
APPLICATOR VISTASEAL 35 (MISCELLANEOUS) ×2 IMPLANT
CHLORAPREP W/TINT 26 (MISCELLANEOUS) ×2 IMPLANT
CLIP SUT LAPRA TY ABSORB (SUTURE) ×2 IMPLANT
CLIP VESOLOCK LG 6/CT PURPLE (CLIP) ×2 IMPLANT
CLIP VESOLOCK MED LG 6/CT (CLIP) ×4 IMPLANT
CLIP VESOLOCK XL 6/CT (CLIP) IMPLANT
COVER SURGICAL LIGHT HANDLE (MISCELLANEOUS) ×2 IMPLANT
COVER TIP SHEARS 8 DVNC (MISCELLANEOUS) ×1 IMPLANT
COVER TIP SHEARS 8MM DA VINCI (MISCELLANEOUS) ×2
COVER WAND RF STERILE (DRAPES) IMPLANT
CUTTER ECHEON FLEX ENDO 45 340 (ENDOMECHANICALS) IMPLANT
DECANTER SPIKE VIAL GLASS SM (MISCELLANEOUS) ×2 IMPLANT
DERMABOND ADVANCED (GAUZE/BANDAGES/DRESSINGS) ×1
DERMABOND ADVANCED .7 DNX12 (GAUZE/BANDAGES/DRESSINGS) ×1 IMPLANT
DRAIN CHANNEL 15F RND FF 3/16 (WOUND CARE) IMPLANT
DRAPE ARM DVNC X/XI (DISPOSABLE) ×4 IMPLANT
DRAPE COLUMN DVNC XI (DISPOSABLE) ×1 IMPLANT
DRAPE DA VINCI XI ARM (DISPOSABLE) ×8
DRAPE DA VINCI XI COLUMN (DISPOSABLE) ×2
DRAPE INCISE IOBAN 66X45 STRL (DRAPES) ×2 IMPLANT
DRAPE SHEET LG 3/4 BI-LAMINATE (DRAPES) ×2 IMPLANT
ELECT REM PT RETURN 15FT ADLT (MISCELLANEOUS) ×2 IMPLANT
EVACUATOR SILICONE 100CC (DRAIN) IMPLANT
GLOVE BIO SURGEON STRL SZ 6.5 (GLOVE) ×2 IMPLANT
GLOVE BIOGEL M STRL SZ7.5 (GLOVE) ×4 IMPLANT
GLOVE BIOGEL PI IND STRL 8 (GLOVE) ×2 IMPLANT
GLOVE BIOGEL PI INDICATOR 8 (GLOVE) ×2
GOWN STRL REUS W/TWL LRG LVL3 (GOWN DISPOSABLE) ×2 IMPLANT
GOWN STRL REUS W/TWL XL LVL3 (GOWN DISPOSABLE) ×4 IMPLANT
HEMOSTAT SURGICEL 4X8 (HEMOSTASIS) ×4 IMPLANT
IRRIG SUCT STRYKERFLOW 2 WTIP (MISCELLANEOUS) ×2
IRRIGATION SUCT STRKRFLW 2 WTP (MISCELLANEOUS) ×1 IMPLANT
KIT BASIN OR (CUSTOM PROCEDURE TRAY) ×2 IMPLANT
KIT TURNOVER KIT A (KITS) IMPLANT
MARKER SKIN DUAL TIP RULER LAB (MISCELLANEOUS) ×2 IMPLANT
NEEDLE INSUFFLATION 14GA 120MM (NEEDLE) ×2 IMPLANT
PENCIL SMOKE EVACUATOR (MISCELLANEOUS) IMPLANT
POUCH SPECIMEN RETRIEVAL 10MM (ENDOMECHANICALS) ×2 IMPLANT
PROTECTOR NERVE ULNAR (MISCELLANEOUS) ×4 IMPLANT
SCISSORS LAP 5X45 EPIX DISP (ENDOMECHANICALS) ×2 IMPLANT
SEAL CANN UNIV 5-8 DVNC XI (MISCELLANEOUS) ×3 IMPLANT
SEAL XI 5MM-8MM UNIVERSAL (MISCELLANEOUS) ×6
SEALANT SURGICAL APPL DUAL CAN (MISCELLANEOUS) IMPLANT
SET TUBE SMOKE EVAC HIGH FLOW (TUBING) ×2 IMPLANT
SOLUTION ELECTROLUBE (MISCELLANEOUS) ×2 IMPLANT
STAPLE RELOAD 45 WHT (STAPLE) IMPLANT
STAPLE RELOAD 45MM WHITE (STAPLE)
SUT ETHILON 2 0 PSLX (SUTURE) IMPLANT
SUT MNCRL AB 4-0 PS2 18 (SUTURE) ×4 IMPLANT
SUT PDS AB 0 CT1 36 (SUTURE) IMPLANT
SUT PROLENE 4 0 RB 1 (SUTURE) ×2
SUT PROLENE 4-0 RB1 .5 CRCL 36 (SUTURE) ×1 IMPLANT
SUT V-LOC BARB 180 2/0GR6 GS22 (SUTURE)
SUT VIC AB 1 CT1 27 (SUTURE) ×8
SUT VIC AB 1 CT1 27XBRD ANTBC (SUTURE) ×4 IMPLANT
SUT VICRYL 0 UR6 27IN ABS (SUTURE) ×2 IMPLANT
SUT VLOC BARB 180 ABS3/0GR12 (SUTURE) ×4
SUTURE V-LC BRB 180 2/0GR6GS22 (SUTURE) IMPLANT
SUTURE VLOC BRB 180 ABS3/0GR12 (SUTURE) ×2 IMPLANT
TOWEL OR 17X26 10 PK STRL BLUE (TOWEL DISPOSABLE) ×2 IMPLANT
TOWEL OR NON WOVEN STRL DISP B (DISPOSABLE) ×2 IMPLANT
TRAY FOLEY MTR SLVR 16FR STAT (SET/KITS/TRAYS/PACK) ×2 IMPLANT
TRAY LAPAROSCOPIC (CUSTOM PROCEDURE TRAY) ×2 IMPLANT
TROCAR BLADELESS OPT 5 100 (ENDOMECHANICALS) IMPLANT
TROCAR UNIVERSAL OPT 12M 100M (ENDOMECHANICALS) IMPLANT
TROCAR XCEL 12X100 BLDLESS (ENDOMECHANICALS) ×2 IMPLANT
WATER STERILE IRR 1000ML POUR (IV SOLUTION) ×2 IMPLANT

## 2019-09-24 NOTE — H&P (Signed)
Urology Preoperative H&P   Chief Complaint: Right renal mass  History of Present Illness: Marco Stevenson is a 64 y.o. male who was found to have a 1.2 cm mesophytic right lower pole renal mass with features concerning for RCC on MRI in April 2021. The lesion was incidentally identified during a work-up for gastric and colonic polyps that were ultimately found to be benign. He is a non-smoker and works as Personnel officer. He denies a personal/family history of GU malignancies. No prior abdominal surgery. Hx of right IHR. Hx of alcohol abuse following his brother's death last year.   Past Medical History:  Diagnosis Date  . Acid reflux   . Anxiety   . Depression   . Hypertension     Past Surgical History:  Procedure Laterality Date  . BIOPSY  05/14/2018   Procedure: BIOPSY;  Surgeon: Vida Rigger, MD;  Location: [facility] ENDOSCOPY;  Service: Endoscopy;;  . COLONOSCOPY    . ESOPHAGOGASTRODUODENOSCOPY (EGD) WITH PROPOFOL N/A 05/12/2018   Procedure: ESOPHAGOGASTRODUODENOSCOPY (EGD) WITH PROPOFOL;  Surgeon: West Bali, MD;  Location: [facility];  Service: Endoscopy;  Laterality: N/A;  . ESOPHAGOGASTRODUODENOSCOPY (EGD) WITH PROPOFOL N/A 05/14/2018   Procedure: ESOPHAGOGASTRODUODENOSCOPY (EGD) WITH PROPOFOL;  Surgeon: Vida Rigger, MD;  Location: [facility] ENDOSCOPY;  Service: Endoscopy;  Laterality: N/A;  start with side-viewing scope  . ESOPHAGOGASTRODUODENOSCOPY (EGD) WITH PROPOFOL N/A 09/23/2018   Procedure: ESOPHAGOGASTRODUODENOSCOPY (EGD) WITH PROPOFOL;  Surgeon: Willis Modena, MD;  Location: [facility] ENDOSCOPY;  Service: Endoscopy;  Laterality: N/A;  endo loop  . EUS N/A 09/23/2018   Procedure: UPPER ENDOSCOPIC ULTRASOUND (EUS) RADIAL;  Surgeon: Willis Modena, MD;  Location: [facility] ENDOSCOPY;  Service: Endoscopy;  Laterality: N/A;  . HERNIA REPAIR    . POLYPECTOMY  05/12/2018   Procedure: POLYPECTOMY;  Surgeon: West Bali, MD;  Location: [facility];  Service: Endoscopy;;  polyp gastric  .  POLYPECTOMY  09/23/2018   Procedure: POLYPECTOMY;  Surgeon: Willis Modena, MD;  Location: [facility] ENDOSCOPY;  Service: Endoscopy;;  . SUBMUCOSAL INJECTION  09/23/2018   Procedure: SUBMUCOSAL INJECTION;  Surgeon: Willis Modena, MD;  Location: [facility] ENDOSCOPY;  Service: Endoscopy;;    Allergies: No Known Allergies  Family History  Problem Relation Age of Onset  . Hypertension Father   . Colon cancer Neg Hx   . Colon polyps Neg Hx     Social History:  reports that he has never smoked. He has never used smokeless tobacco. He reports current alcohol use. He reports that he does not use drugs.  ROS: A complete review of systems was performed.  All systems are negative except for pertinent findings as noted.  Physical Exam:  Vital signs in last 24 hours: Temp:  [98.3 F (36.8 C)] 98.3 F (36.8 C) (08/06 1046) Pulse Rate:  [90] 90 (08/06 1046) Resp:  [18] 18 (08/06 1046) BP: (149)/(93) 149/93 (08/06 1046) SpO2:  [95 %] 95 % (08/06 1046) Constitutional:  Alert and oriented, No acute distress Cardiovascular: Regular rate and rhythm, No JVD Respiratory: Normal respiratory effort, Lungs clear bilaterally GI: Abdomen is soft, nontender, nondistended, no abdominal masses GU: No CVA tenderness Lymphatic: No lymphadenopathy Neurologic: Grossly intact, no focal deficits Psychiatric: Normal mood and affect  Laboratory Data:  No results for input(s): WBC, HGB, HCT, PLT in the last 72 hours.  No results for input(s): NA, K, CL, GLUCOSE, BUN, CALCIUM, CREATININE in the last 72 hours.  Invalid input(s): CO3   No results found for this or any previous visit (from  the past 24 hour(s)). Recent Results (from the past 240 hour(s))  SARS CORONAVIRUS 2 (TAT 6-24 HRS) Nasopharyngeal Nasopharyngeal Swab     Status: None   Collection Time: 09/21/19 11:01 AM   Specimen: Nasopharyngeal Swab  Result Value Ref Range Status   SARS Coronavirus 2 NEGATIVE NEGATIVE Final    Comment: (NOTE) SARS-CoV-2 target  nucleic acids are NOT DETECTED.  The SARS-CoV-2 RNA is generally detectable in upper and lower respiratory specimens during the acute phase of infection. Negative results do not preclude SARS-CoV-2 infection, do not rule out co-infections with other pathogens, and should not be used as the sole basis for treatment or other patient management decisions. Negative results must be combined with clinical observations, patient history, and epidemiological information. The expected result is Negative.  Fact Sheet for Patients: HairSlick.no  Fact Sheet for Healthcare Providers: quierodirigir.com  This test is not yet approved or cleared by the [place] FDA and  has been authorized for detection and/or diagnosis of SARS-CoV-2 by FDA under an Emergency Use Authorization (EUA). This EUA will remain  in effect (meaning this test can be used) for the duration of the COVID-19 declaration under Se ction 564(b)(1) of the Act, 21 U.S.C. section 360bbb-3(b)(1), unless the authorization is terminated or revoked sooner.  Performed at [facility], [address], [place], [place] [address]     Renal Function: No results for input(s): CREATININE in the last 168 hours. CrCl cannot be calculated (Unknown ideal weight.).  Radiologic Imaging: CLINICAL DATA: Lower pole right renal mass on CT.   EXAM:  MRI ABDOMEN WITHOUT AND WITH CONTRAST   TECHNIQUE:  Multiplanar multisequence MR imaging of the abdomen was performed  both before and after the administration of intravenous contrast.   CONTRAST: 4mL MULTIHANCE GADOBENATE DIMEGLUMINE 529 MG/ML IV SOLN   COMPARISON: CT 05/14/2019 and 04/09/2019   FINDINGS:  Lower chest: Normal heart size without pericardial or pleural  effusion.   Hepatobiliary: Normal imaged liver, gallbladder, biliary tract.   Pancreas: Normal, without mass or ductal dilatation.   Spleen: Normal in size,  without focal abnormality.   Adrenals/Urinary Tract: Normal adrenal glands. Tiny bilateral renal  lesions, which are likely cysts.   Corresponding to the CT abnormality, within the posterior lower pole  right kidney, is a 1.2 cm lesion which demonstrates early  post-contrast enhancement, including on 43/11 and 43/12. There is  either a cystic component positioned anteriorly or an adjacent  simple cyst. Example 7 mm on 40/11.   No hydronephrosis.   Stomach/Bowel: Normal imaged stomach and colon. A transverse  duodenal diverticulum.   Vascular/Lymphatic: Aortic atherosclerosis. Accessory lower pole  left renal artery. Single right renal artery. Patent right renal  vein. No abdominal adenopathy.   Other: No ascites.   Musculoskeletal: Mild T11 compression deformity. T12 and L4  vertebral hemangiomas.   IMPRESSION:  1. Posterior inter/lower pole right renal lesion, consistent with  renal cell carcinoma.  2. No right renal vein involvement or evidence of metastatic  disease.  3. Aortic Atherosclerosis (ICD10-I70.0).    Electronically Signed  By: Jeronimo Greaves M.D.  On: 06/18/2019 14:13  I independently reviewed the above imaging studies.  Assessment and Plan Marco Stevenson is a 64 y.o. male with a solid and enhancing right renal mass with features concerning for RCC.   -I personally reviewed imaging results and films with the patient. We discussed that the mass in question has features concerning for malignancy. I explained the natural history of  presumed renal cell carcinoma. I reviewed the AUA guidelines for evaluation and treatment of the small renal mass. The options of active surveillance, in situ tumor ablation, partial and radical nephrectomy was discussed. The risks of robotic RIGHT partial nephrectomy were discussed in detail including but not limited to: negative pathology, open conversion, completion nephrectomy, infection of the urinary tract/skin/abdominal cavity,  VTE, MI/CVA, lymphatic leak, injury to adjacent solid/hollow viscus organs, bleeding requiring a blood transfusion, catastrophic bleeding, hernia formation, need for postoperative angioembolization, urinary leak requiring stent/drain, and other imponderables.    Rhoderick Moody, MD 09/24/2019, 11:50 AM  [facility] Pager: [phone]

## 2019-09-24 NOTE — Anesthesia Postprocedure Evaluation (Signed)
Anesthesia Post Note  Patient: Marco Stevenson  Procedure(s) Performed: XI ROBOTIC ASSITED PARTIAL NEPHRECTOMY (Right )     Patient location during evaluation: PACU Anesthesia Type: General Level of consciousness: awake and alert and oriented Pain management: pain level controlled Vital Signs Assessment: post-procedure vital signs reviewed and stable Respiratory status: spontaneous breathing, nonlabored ventilation and respiratory function stable Cardiovascular status: blood pressure returned to baseline Postop Assessment: no apparent nausea or vomiting Anesthetic complications: no   No complications documented.  Last Vitals:  Vitals:   09/24/19 1700 09/24/19 1716  BP: (!) 148/97 133/89  Pulse: 80 79  Resp: 12 18  Temp: 36.8 C 36.8 C  SpO2: 96% 96%    Last Pain:  Vitals:   09/24/19 1722  TempSrc:   PainSc: 1                  Kaylyn Layer

## 2019-09-24 NOTE — Discharge Instructions (Signed)

## 2019-09-24 NOTE — Op Note (Signed)
Operative Note  Preoperative diagnosis:  1.  1.2 cm right renal mass  Postoperative diagnosis: 1.  1.2 cm right renal mass  Procedure(s): 1.  Robot-assisted laparoscopic right partial nephrectomy 2.  Intraoperative ultrasound of single retroperitoneal organ  Surgeon: Rhoderick Moody, MD  Assistants:  None  Anesthesia:  General  Complications:  5 mm vena caval rent at the junction of the right gonadal vein--repaired with 4-0 Prolene and was hemostatic at the conclusion of the case  EBL: 200 mL  Specimens: 1.  Right renal mass  Drains/Catheters: 1.  16 French Foley catheter  Intraoperative findings:   1. Mesophytic1.2 cm right renal mass with grossly negative surgical margins 2. Renorrhaphy was hemostatic at the conclusion of the case  Indication:  Marco Stevenson is a 64 y.o. male with 1.2 cm solid enhancing right renal mass with features concerning for renal cell carcinoma.  He has been consented for the above procedures, voices understanding and wishes to proceed.  Description of procedure:  After informed consent was signed, the patient was taken back to the operating room and properly anesthetized.  The patient was then placed in the left lateral decubitus position with all pressure points padded.  The abdomen was then prepped and draped in the usual sterile fashion.  A time-out was then performed.    An 8 mm incision was then made lateral to the right rectus muscle at the level of the right 12th rib.  A Veress needle was then used to access the abdominal cavity.  A saline drop test showed no signs of obstruction and aspiration of the Veress needle revealed no blood or sucus.  The abdominal cavity was then insufflated to 15 mmHg.  An 8 mm robotic trocar was then atraumatically inserted into the abdominal cavity.  The robotic camera was then inserted through the port and inspection of the abdominal cavity revealed no evidence of adjacent organ or vessel injury.  We then  placed three additional 8 mm robotic ports and a 15 mm assistant port.  The robot was then docked into position.   Using a combination of blunt and cold scissors dissection, the hepatic attachments were released from the abdominal sidewall.  A locking grasper was then inserted through the 5 mm sub-xyphoid port and used to retract the posterior surface of the liver more cephalad.  The white line of Toldt along the ascending colon was then incised, allowing [place] to reflect the colon medially and expose the anterior surface of the right kidney.  The duodenum was then Kocherized medially, which abruptly led [place] to identification of the inferior vena cava.    Once the colon was adequately mobilized, we moved to the lower pole and identified the gonadal vein and ureter.  The gonadal vein was then left running parallel to the vena cava and the right ureter was reflected anteriorly.  Using cautious cautery, the overlying perihilar attachments were then released.  This yielded visualization of the renal hilum, which included a single right renal vein and a single right renal artery.  The perilymphatic tissue surrounding the right renal artery were carefully released so that the right renal artery was fully encircled.    We next turned our attention to defatting the kidney.  An anterior incision along Gerota's fascia was created and the kidney was fully mobilized.   The renal mass is identified at the posterior mid pole.  Using intraoperative ultrasound, the tumor was then carefully evaluated and demonstrated heterogenous echogenicity compared to the rest  of the renal parenchyma. The resection margin was marked to allow for wide excision of the renal mass.  The lower pole of the kidney was then lifted and a small rent was created at the junction of the right gonadal vein and vena cava that led to brisk bleeding.  A 4-0 Prolene suture was then thrown in the defect in a figure-of-eight fashion with complete hemostasis.  The  renal hilum was once again identified and a bulldog clamp was placed.    Using cold scissors, the right renal mass was then carefully excised, leaving a grossly negative margin.  Excision of the mass appeared complete with no tumor grossly remaining.  The tumor was then placed in the right lower quadrant, to be retrieved following repair of the renal defect.  A running 3-0 V lock suture was then used to reapproximate the resection bed.  Tension was placed with hemo-lock clips.   The right renal capsule was then reapproximated using a 0 Vicryl suture on a CT-1 needle in an interrupted fashion using hemo-lock clips as a buttress.  Lapra-Ty's were then placed on each of the interrupted sutures.  The bulldog was then released, which warm ischemia time totaled 16 minutes.   Once hemostasis was achieved and the renal bed was irrigated, Surgicel and EVICEL was then placed over the renorrhaphy.  Gerota's fascia was then reapproximated with a running v-lok suture and the mesocolonic fat along the descending colon was then reapproximated to the right abdominal sidewall using Hem-o-lok clips.  The renal mass was retrieved and placed in an Endo Catch bag.  The mass was extracted through the 15 mm assistant port.  The fascia of the assistant port was then reapproximated with a 0 Vicryl suture.    The abdomen was desufflated with all ports removed.  The skin was then reapproximated using running Monocryl and dressed appropriately.  The patient was then replaced in the supine position and was awakened from anesthesia without complications.  Warm Ischemia Time: 16 minutes   Plan: Monitor on the floor with bedrest overnight.  Foley on the morning.  Advance diet as tolerated.

## 2019-09-24 NOTE — Transfer of Care (Signed)
Immediate Anesthesia Transfer of Care Note  Patient: Marco Stevenson  Procedure(s) Performed: Procedure(s): XI ROBOTIC ASSITED PARTIAL NEPHRECTOMY (Right)  Patient Location: PACU  Anesthesia Type:General  Level of Consciousness: Alert, Awake, Oriented  Airway & Oxygen Therapy: Patient Spontanous Breathing  Post-op Assessment: Report given to RN  Post vital signs: Reviewed and stable  Last Vitals:  Vitals:   09/24/19 1046  BP: (!) 149/93  Pulse: 90  Resp: 18  Temp: 36.8 C  SpO2: 95%    Complications: No apparent anesthesia complications

## 2019-09-24 NOTE — Anesthesia Procedure Notes (Signed)
Procedure Name: Intubation Date/Time: 09/24/2019 12:32 PM Performed by: Gerald Leitz, CRNA Pre-anesthesia Checklist: Patient identified, Patient being monitored, Timeout performed, Emergency Drugs available and Suction available Patient Re-evaluated:Patient Re-evaluated prior to induction Oxygen Delivery Method: Circle system utilized Preoxygenation: Pre-oxygenation with 100% oxygen Induction Type: IV induction Ventilation: Mask ventilation without difficulty Laryngoscope Size: Mac and 3 Grade View: Grade I Tube type: Oral Tube size: 7.5 mm Number of attempts: 1 Airway Equipment and Method: Stylet Placement Confirmation: ETT inserted through vocal cords under direct vision,  positive ETCO2 and breath sounds checked- equal and bilateral Secured at: 23 cm Tube secured with: Tape Dental Injury: Teeth and Oropharynx as per pre-operative assessment

## 2019-09-25 ENCOUNTER — Encounter (HOSPITAL_COMMUNITY): Payer: Self-pay | Admitting: Urology

## 2019-09-25 DIAGNOSIS — N2889 Other specified disorders of kidney and ureter: Secondary | ICD-10-CM | POA: Diagnosis not present

## 2019-09-25 LAB — BASIC METABOLIC PANEL
Anion gap: 9 (ref 5–15)
BUN: 8 mg/dL (ref 8–23)
CO2: 25 mmol/L (ref 22–32)
Calcium: 8.5 mg/dL — ABNORMAL LOW (ref 8.9–10.3)
Chloride: 106 mmol/L (ref 98–111)
Creatinine, Ser: 0.84 mg/dL (ref 0.61–1.24)
GFR calc Af Amer: >60 mL/min (ref >60)
GFR calc non Af Amer: >60 mL/min (ref >60)
Glucose, Bld: 120 mg/dL — ABNORMAL HIGH (ref 70–99)
Potassium: 3.6 mmol/L (ref 3.5–5.1)
Sodium: 140 mmol/L (ref 135–145)

## 2019-09-25 LAB — HEMOGLOBIN AND HEMATOCRIT, BLOOD
HCT: 40.9 % (ref 39.0–52.0)
Hemoglobin: 14 g/dL (ref 13.0–17.0)

## 2019-09-25 MED ORDER — ACETAMINOPHEN 500 MG PO TABS
1000.0000 mg | ORAL_TABLET | Freq: Three times a day (TID) | ORAL | Status: DC
  Administered 2019-09-25 (×2): 1000 mg via ORAL
  Filled 2019-09-25 (×2): qty 2

## 2019-09-25 NOTE — Discharge Summary (Signed)
[facility] Discharge Summary  Admit date: 09/24/2019  Discharge date and time: 09/25/19   Discharge to: Home  Discharge Service: Urology  Discharge Attending Physician:  Dr Cristal Deer Liliane Shi  Discharge  Diagnoses: Right renal mass  Secondary Diagnosis: Active Problems:   Renal mass   OR Procedures: Procedure(s): XI ROBOTIC ASSITED PARTIAL NEPHRECTOMY 09/24/2019   Ancillary Procedures: None   Discharge Day Services: The patient was seen and examined by the Urology team both in the morning and immediately prior to discharge.  Vital signs and laboratory values were stable and within normal limits.  The physical exam was benign and unchanged and all surgical wounds were examined.  Discharge instructions were explained and all questions answered.  Subjective  No acute events overnight. Pain Controlled. No fever or chills.  Objective Patient Vitals for the past 8 hrs:  BP Temp Temp src Pulse Resp SpO2  09/25/19 0532 (!) 136/96 98.2 F (36.8 C) Oral 75 20 97 %  09/25/19 0152 123/89 98.2 F (36.8 C) Oral 70 20 95 %   No intake/output data recorded.  General Appearance:        No acute distress Lungs:                       Normal work of breathing on room air Heart:                                Regular rate and rhythm Abdomen:                         Soft, non-tender, non-distended Extremities:                      Warm and well perfused   Hospital Course:  The patient underwent right robotic assisted partial nephrectomy on 09/24/2019.  The patient tolerated the procedure well, was extubated in the OR, and afterwards was taken to the PACU for routine post-surgical care. When stable the patient was transferred to the floor.   The patient did well postoperatively.  The patient's diet was slowly advanced and at the time of discharge was tolerating a regular diet.  The patient was discharged home 1 Day Post-Op, at which point was tolerating a regular solid diet, was able to void  spontaneously, have adequate pain control with P.O. pain medication, and could ambulate without difficulty. The patient will follow up with [place] for post op check.   Condition at Discharge: Improved  Discharge Medications:  Allergies as of 09/25/2019   No Known Allergies     Medication List    STOP taking these medications   amoxicillin-clavulanate 875-125 MG tablet Commonly known as: AUGMENTIN   multivitamin with minerals Tabs tablet   saccharomyces boulardii 250 MG capsule Commonly known as: FLORASTOR   sildenafil 25 MG tablet Commonly known as: VIAGRA   vitamin B-12 1000 MCG tablet Commonly known as: CYANOCOBALAMIN     TAKE these medications   acetaminophen 325 MG tablet Commonly known as: TYLENOL Take 2 tablets (650 mg total) by mouth every 6 (six) hours as needed for mild pain or moderate pain (Call Dr. Marlane Hatcher office if you need more pain medicine).   ALPRAZolam 1 MG tablet Commonly known as: XANAX Take 0.5 mg by mouth at bedtime as needed for sleep.   docusate sodium 100 MG capsule Commonly known as: COLACE Take 1  capsule (100 mg total) by mouth 2 (two) times daily.   HYDROcodone-acetaminophen 5-325 MG tablet Commonly known as: Norco Take 1-2 tablets by mouth every 6 (six) hours as needed for moderate pain.   losartan 100 MG tablet Commonly known as: COZAAR Take 100 mg by mouth daily.   pantoprazole 40 MG tablet Commonly known as: PROTONIX Take 1 tablet (40 mg total) by mouth daily.   promethazine 12.5 MG tablet Commonly known as: PHENERGAN Take 1 tablet (12.5 mg total) by mouth every 4 (four) hours as needed for nausea or vomiting.   VISINE OP Place 1 drop into both eyes daily as needed (dry eyes).

## 2019-09-25 NOTE — Progress Notes (Signed)
1 Day Post-Op Subjective: The patient is doing well.  Reports gas pain overnight, passing flatus. No nausea or vomiting. Pain is adequately controlled.  Objective: Vital signs in last 24 hours: Temp:  [98.2 F (36.8 C)-99 F (37.2 C)] 98.2 F (36.8 C) (08/07 0532) Pulse Rate:  [70-90] 75 (08/07 0532) Resp:  [12-20] 20 (08/07 0532) BP: (123-149)/(87-100) 136/96 (08/07 0532) SpO2:  [94 %-98 %] 97 % (08/07 0532) Weight:  [95.3 kg] 95.3 kg (08/06 1716)  Intake/Output from previous day: 08/06 0701 - 08/07 0700 In: 3808.4 [P.O.:500; I.V.:3208.4; IV Piggyback:100] Out: 350 [Urine:150; Blood:200] Intake/Output this shift: No intake/output data recorded.  Physical Exam:  General: Alert and oriented. CV: RRR Lungs: Clear bilaterally. GI: Soft, Nondistended. Incisions: Clean and dry. Urine: Clear Extremities: Nontender, no erythema, no edema.  Lab Results: Recent Labs    09/24/19 1600 09/25/19 0536  HGB 14.9 14.0  HCT 42.7 40.9          Recent Labs    09/25/19 0536  CREATININE 0.84           Results for orders placed or performed during the hospital encounter of 09/24/19 (from the past 24 hour(s))  Hemoglobin and hematocrit, blood     Status: None   Collection Time: 09/24/19  4:00 PM  Result Value Ref Range   Hemoglobin 14.9 13.0 - 17.0 g/dL   HCT 42.6 39 - 52 %  Basic metabolic panel     Status: Abnormal   Collection Time: 09/25/19  5:36 AM  Result Value Ref Range   Sodium 140 135 - 145 mmol/L   Potassium 3.6 3.5 - 5.1 mmol/L   Chloride 106 98 - 111 mmol/L   CO2 25 22 - 32 mmol/L   Glucose, Bld 120 (H) 70 - 99 mg/dL   BUN 8 8 - 23 mg/dL   Creatinine, Ser 8.34 0.61 - 1.24 mg/dL   Calcium 8.5 (L) 8.9 - 10.3 mg/dL   GFR calc non Af Amer >60 >60 mL/min   GFR calc Af Amer >60 >60 mL/min   Anion gap 9 5 - 15  Hemoglobin and hematocrit, blood     Status: None   Collection Time: 09/25/19  5:36 AM  Result Value Ref Range   Hemoglobin 14.0 13.0 - 17.0 g/dL   HCT  19.6 39 - 52 %    Assessment/Plan: POD# 1 s/p robotic partial nephrectomy.  1) Ambulate, Incentive spirometry 2) Advance diet as tolerated 3) Transition to oral pain medication 4) Dulcolax suppository PRN 5) D/C urethral catheter    LOS: 0 days   Thea Alken 09/25/2019, 8:42 AM

## 2019-09-27 LAB — SURGICAL PATHOLOGY

## 2020-05-26 ENCOUNTER — Encounter (HOSPITAL_COMMUNITY): Payer: Self-pay | Admitting: *Deleted

## 2020-05-26 ENCOUNTER — Inpatient Hospital Stay (HOSPITAL_COMMUNITY)
Admission: EM | Admit: 2020-05-26 | Discharge: 2020-05-28 | DRG: 394 | Disposition: A | Discharge status: Home / Self Care | Payer: 59 | Attending: Internal Medicine | Admitting: Internal Medicine

## 2020-05-26 ENCOUNTER — Other Ambulatory Visit: Payer: Self-pay

## 2020-05-26 DIAGNOSIS — D72829 Elevated white blood cell count, unspecified: Secondary | ICD-10-CM | POA: Diagnosis present

## 2020-05-26 DIAGNOSIS — D62 Acute posthemorrhagic anemia: Secondary | ICD-10-CM | POA: Diagnosis present

## 2020-05-26 DIAGNOSIS — Z905 Acquired absence of kidney: Secondary | ICD-10-CM | POA: Diagnosis not present

## 2020-05-26 DIAGNOSIS — I10 Essential (primary) hypertension: Secondary | ICD-10-CM | POA: Diagnosis present

## 2020-05-26 DIAGNOSIS — F1010 Alcohol abuse, uncomplicated: Secondary | ICD-10-CM | POA: Diagnosis present

## 2020-05-26 DIAGNOSIS — E66811 Obesity, class 1: Secondary | ICD-10-CM

## 2020-05-26 DIAGNOSIS — E46 Unspecified protein-calorie malnutrition: Secondary | ICD-10-CM

## 2020-05-26 DIAGNOSIS — Z28310 Unvaccinated for covid-19: Secondary | ICD-10-CM | POA: Diagnosis not present

## 2020-05-26 DIAGNOSIS — E669 Obesity, unspecified: Secondary | ICD-10-CM | POA: Diagnosis present

## 2020-05-26 DIAGNOSIS — E441 Mild protein-calorie malnutrition: Secondary | ICD-10-CM | POA: Diagnosis present

## 2020-05-26 DIAGNOSIS — K920 Hematemesis: Secondary | ICD-10-CM | POA: Diagnosis not present

## 2020-05-26 DIAGNOSIS — Z79899 Other long term (current) drug therapy: Secondary | ICD-10-CM

## 2020-05-26 DIAGNOSIS — K317 Polyp of stomach and duodenum: Principal | ICD-10-CM | POA: Diagnosis present

## 2020-05-26 DIAGNOSIS — Z6830 Body mass index (BMI) 30.0-30.9, adult: Secondary | ICD-10-CM | POA: Diagnosis not present

## 2020-05-26 DIAGNOSIS — R195 Other fecal abnormalities: Secondary | ICD-10-CM | POA: Diagnosis present

## 2020-05-26 DIAGNOSIS — Z8249 Family history of ischemic heart disease and other diseases of the circulatory system: Secondary | ICD-10-CM

## 2020-05-26 DIAGNOSIS — K922 Gastrointestinal hemorrhage, unspecified: Secondary | ICD-10-CM | POA: Diagnosis present

## 2020-05-26 DIAGNOSIS — R7401 Elevation of levels of liver transaminase levels: Secondary | ICD-10-CM

## 2020-05-26 DIAGNOSIS — K219 Gastro-esophageal reflux disease without esophagitis: Secondary | ICD-10-CM | POA: Diagnosis present

## 2020-05-26 DIAGNOSIS — R112 Nausea with vomiting, unspecified: Secondary | ICD-10-CM

## 2020-05-26 DIAGNOSIS — K921 Melena: Secondary | ICD-10-CM | POA: Diagnosis not present

## 2020-05-26 DIAGNOSIS — Z20822 Contact with and (suspected) exposure to covid-19: Secondary | ICD-10-CM | POA: Diagnosis present

## 2020-05-26 DIAGNOSIS — R739 Hyperglycemia, unspecified: Secondary | ICD-10-CM

## 2020-05-26 DIAGNOSIS — E8809 Other disorders of plasma-protein metabolism, not elsewhere classified: Secondary | ICD-10-CM | POA: Diagnosis present

## 2020-05-26 LAB — CBC WITH DIFFERENTIAL/PLATELET
Abs Immature Granulocytes: 0.05 10*3/uL (ref 0.00–0.07)
Basophils Absolute: 0.1 10*3/uL (ref 0.0–0.1)
Basophils Absolute: 0 10*3/uL (ref 0.0–0.1)
Basophils Relative: 1 %
Basophils Relative: 0 %
Eosinophils Absolute: 0.1 10*3/uL (ref 0.0–0.5)
Eosinophils Absolute: 0 10*3/uL (ref 0.0–0.5)
Eosinophils Relative: 1 %
Eosinophils Relative: 0 %
HCT: 42.9 % (ref 39.0–52.0)
HCT: 41 % (ref 39.0–52.0)
Hemoglobin: 15.4 g/dL (ref 13.0–17.0)
Hemoglobin: 13.5 g/dL (ref 13.0–17.0)
Immature Granulocytes: 1 %
Lymphocytes Relative: 14 %
Lymphocytes Relative: 12 %
Lymphs Abs: 1.5 10*3/uL (ref 0.7–4.0)
Lymphs Abs: 1.7 10*3/uL (ref 0.7–4.0)
MCH: 34.2 pg — ABNORMAL HIGH (ref 26.0–34.0)
MCHC: 35.9 g/dL (ref 30.0–36.0)
MCHC: 32.9 g/dL (ref 30.0–36.0)
MCV: 95.3 fL (ref 80.0–100.0)
MCV: 104.1 fL — ABNORMAL HIGH (ref 80.0–100.0)
Monocytes Absolute: 1.4 10*3/uL — ABNORMAL HIGH (ref 0.1–1.0)
Monocytes Absolute: 2 10*3/uL — ABNORMAL HIGH (ref 0.1–1.0)
Monocytes Relative: 13 %
Monocytes Relative: 13 %
Neutro Abs: 7.5 10*3/uL (ref 1.7–7.7)
Neutro Abs: 10.7 10*3/uL — ABNORMAL HIGH (ref 1.7–7.7)
Neutrophils Relative %: 70 %
Neutrophils Relative %: 73 %
Platelets: 187 10*3/uL (ref 150–400)
Platelets: 209 10*3/uL (ref 150–400)
RBC: 4.5 MIL/uL (ref 4.22–5.81)
RBC: 3.94 MIL/uL — ABNORMAL LOW (ref 4.22–5.81)
RDW: 12 % (ref 11.5–15.5)
RDW: 12.2 % (ref 11.5–15.5)
WBC: 10.6 10*3/uL — ABNORMAL HIGH (ref 4.0–10.5)
WBC: 14.6 10*3/uL — ABNORMAL HIGH (ref 4.0–10.5)
nRBC: 0 % (ref 0.0–0.2)

## 2020-05-26 LAB — COMPREHENSIVE METABOLIC PANEL
ALT: 68 U/L — ABNORMAL HIGH (ref 0–44)
AST: 82 U/L — ABNORMAL HIGH (ref 15–41)
Albumin: 3.3 g/dL — ABNORMAL LOW (ref 3.5–5.0)
Alkaline Phosphatase: 98 U/L (ref 38–126)
Anion gap: 11 (ref 5–15)
BUN: 11 mg/dL (ref 8–23)
CO2: 24 mmol/L (ref 22–32)
Calcium: 8.3 mg/dL — ABNORMAL LOW (ref 8.9–10.3)
Chloride: 104 mmol/L (ref 98–111)
Creatinine, Ser: 0.85 mg/dL (ref 0.61–1.24)
GFR, Estimated: >60 mL/min (ref >60)
Glucose, Bld: 146 mg/dL — ABNORMAL HIGH (ref 70–99)
Potassium: 3.1 mmol/L — ABNORMAL LOW (ref 3.5–5.1)
Sodium: 139 mmol/L (ref 135–145)
Total Bilirubin: 1.9 mg/dL — ABNORMAL HIGH (ref 0.3–1.2)
Total Protein: 6 g/dL — ABNORMAL LOW (ref 6.5–8.1)

## 2020-05-26 LAB — POC OCCULT BLOOD, ED: Fecal Occult Bld: POSITIVE — AB

## 2020-05-26 LAB — TYPE AND SCREEN
ABO/RH(D): B POS
Antibody Screen: NEGATIVE

## 2020-05-26 LAB — PROTIME-INR
INR: 1 (ref 0.8–1.2)
Prothrombin Time: 12.9 seconds (ref 11.4–15.2)

## 2020-05-26 LAB — RESP PANEL BY RT-PCR (FLU A&B, COVID) ARPGX2
Influenza A by PCR: NEGATIVE
Influenza B by PCR: NEGATIVE
SARS Coronavirus 2 by RT PCR: NEGATIVE

## 2020-05-26 LAB — ETHANOL: Alcohol, Ethyl (B): <10 mg/dL (ref <10)

## 2020-05-26 MED ORDER — THIAMINE HCL 100 MG PO TABS
100.0000 mg | ORAL_TABLET | Freq: Every day | ORAL | Status: DC
  Administered 2020-05-27 – 2020-05-28 (×2): 100 mg via ORAL
  Filled 2020-05-26 (×2): qty 1

## 2020-05-26 MED ORDER — LORAZEPAM 1 MG PO TABS
0.0000 mg | ORAL_TABLET | Freq: Four times a day (QID) | ORAL | Status: DC
  Administered 2020-05-27 – 2020-05-28 (×4): 2 mg via ORAL
  Filled 2020-05-26 (×4): qty 2

## 2020-05-26 MED ORDER — SODIUM CHLORIDE 0.9 % IV SOLN
8.0000 mg/h | INTRAVENOUS | Status: DC
  Administered 2020-05-26 – 2020-05-27 (×2): 8 mg/h via INTRAVENOUS
  Filled 2020-05-26 (×7): qty 80

## 2020-05-26 MED ORDER — FOLIC ACID 1 MG PO TABS
1.0000 mg | ORAL_TABLET | Freq: Every day | ORAL | Status: DC
  Administered 2020-05-27 – 2020-05-28 (×2): 1 mg via ORAL
  Filled 2020-05-26 (×2): qty 1

## 2020-05-26 MED ORDER — OCTREOTIDE LOAD VIA INFUSION
50.0000 ug | Freq: Once | INTRAVENOUS | Status: AC
  Administered 2020-05-26: 50 ug via INTRAVENOUS
  Filled 2020-05-26: qty 25

## 2020-05-26 MED ORDER — PANTOPRAZOLE SODIUM 40 MG IV SOLR
40.0000 mg | Freq: Once | INTRAVENOUS | Status: AC
  Administered 2020-05-26: 40 mg via INTRAVENOUS
  Filled 2020-05-26: qty 40

## 2020-05-26 MED ORDER — LORAZEPAM 1 MG PO TABS: 0.0000 mg | ORAL_TABLET | Freq: Two times a day (BID) | ORAL | Status: DC

## 2020-05-26 MED ORDER — LORAZEPAM 2 MG/ML IJ SOLN
0.0000 mg | Freq: Four times a day (QID) | INTRAMUSCULAR | Status: DC
  Administered 2020-05-26: 2 mg via INTRAVENOUS
  Filled 2020-05-26 (×2): qty 1

## 2020-05-26 MED ORDER — ONDANSETRON HCL 4 MG/2ML IJ SOLN
4.0000 mg | Freq: Four times a day (QID) | INTRAMUSCULAR | Status: DC | PRN
  Administered 2020-05-27: 4 mg via INTRAVENOUS
  Filled 2020-05-26: qty 2

## 2020-05-26 MED ORDER — LORAZEPAM 2 MG/ML IJ SOLN: 0.0000 mg | Freq: Two times a day (BID) | INTRAMUSCULAR | Status: DC

## 2020-05-26 MED ORDER — OCTREOTIDE ACETATE 500 MCG/ML IJ SOLN
INTRAMUSCULAR | Status: AC
  Filled 2020-05-26: qty 1

## 2020-05-26 MED ORDER — SODIUM CHLORIDE 0.9 % IV BOLUS
1000.0000 mL | Freq: Once | INTRAVENOUS | Status: AC
  Administered 2020-05-26: 1000 mL via INTRAVENOUS

## 2020-05-26 MED ORDER — ONDANSETRON HCL 4 MG/2ML IJ SOLN
4.0000 mg | Freq: Once | INTRAMUSCULAR | Status: AC
  Administered 2020-05-26: 4 mg via INTRAVENOUS
  Filled 2020-05-26: qty 2

## 2020-05-26 MED ORDER — THIAMINE HCL 100 MG/ML IJ SOLN
100.0000 mg | Freq: Every day | INTRAMUSCULAR | Status: DC
  Administered 2020-05-26: 100 mg via INTRAVENOUS
  Filled 2020-05-26 (×2): qty 2

## 2020-05-26 MED ORDER — SODIUM CHLORIDE 0.9 % IV SOLN
50.0000 ug/h | INTRAVENOUS | Status: DC
  Administered 2020-05-26 – 2020-05-27 (×2): 50 ug/h via INTRAVENOUS
  Filled 2020-05-26 (×7): qty 1

## 2020-05-26 MED ORDER — ADULT MULTIVITAMIN W/MINERALS CH
1.0000 | ORAL_TABLET | Freq: Every day | ORAL | Status: DC
  Administered 2020-05-27 – 2020-05-28 (×2): 1 via ORAL
  Filled 2020-05-26 (×2): qty 1

## 2020-05-26 NOTE — Plan of Care (Signed)
  Problem: Education: Goal: Knowledge of General Education information will improve Description: Including pain rating scale, medication(s)/side effects and non-pharmacologic comfort measures Outcome: Progressing   Problem: Health Behavior/Discharge Planning: Goal: Ability to manage health-related needs will improve Outcome: Progressing   Problem: Clinical Measurements: Goal: Ability to maintain clinical measurements within normal limits will improve Outcome: Progressing Goal: Will remain free from infection Outcome: Progressing Goal: Diagnostic test results will improve Outcome: Progressing   Problem: Pain Managment: Goal: General experience of comfort will improve Outcome: Progressing   Problem: Safety: Goal: Ability to remain free from injury will improve Outcome: Progressing   Problem: Skin Integrity: Goal: Risk for impaired skin integrity will decrease Outcome: Progressing   

## 2020-05-26 NOTE — Progress Notes (Signed)
I was contacted by the ER regarding this patient.  In short, he is a 65 year old male with history of significant alcohol abuse, GERD, who presented to [facility], ER with melena.  Initial hemoglobin 15.4.  Did have one episode of hematemesis in the ER approximately 100 cc of blood.  Hemodynamics are reassuring.  Agree with IV Protonix.  I would empirically place patient on IV octreotide as well given his chronic alcohol abuse, concern for possible underlying cirrhosis.  Continue to monitor hemoglobin and transfuse for less than 7 or evidence of active bleeding or hemodynamic instability.  We will tentatively plan on EGD in the a.m. to further evaluate.  If patient's clinical condition changes this evening, please page GI at [phone].  Formal consultation in the a.m.

## 2020-05-26 NOTE — H&P (Addendum)
History and Physical  Marco Stevenson YOV:[id number] DOB: 1955/08/22 DOA: 05/26/2020  Referring physician: Angelica Ran PCP: Gareth Morgan, MD  Patient coming from: Home  Chief Complaint: GI bleed  HPI: Marco Stevenson is a 65 y.o. male with medical history significant for hypertension, GERD, alcohol abuse, anxiety, depression who presents to the emergency department due to black stools noted at home today.  Patient states that he had normal stools this morning, but had 2 episodes of black stools this afternoon with the last episode being around 3:30 PM, this was associated with nausea and some generalized weakness.  He states that he has had similar presentation in the past when he had an upper GI bleed.  Patient states that he takes 4 shots of liquor daily with last intake being last night, but denies use of NSAIDs or any anticoagulation.  Patient states that he had 3 episodes of bloody emesis while in the ED.  He denies headache, chest pain, shortness of breath or abdominal pain.  ED Course:  In the emergency department, he was tachypneic, BP was 139/119.  Other vital signs are within normal range.  Work-up in the ED showed H/H 15.4/42.9 and normal BMP except for hyperglycemia.  Albumin 3.3, AST 82, ALT 68.  FOBT was positive.  Alcohol level was , 10. He was started on IV Protonix drip and octreotide drip.  IV hydration was provided.  Gastroenterology (Dr. Marletta Lor) was consulted by ED physician and recommended admission by hospitalist with plan to see patient in the morning.  Patient was empirically started on CIWA protocol.  Review of Systems: Constitutional: Negative for chills and fever.  HENT: Negative for ear pain and sore throat.   Eyes: Negative for pain and visual disturbance.  Respiratory: Negative for cough, chest tightness and shortness of breath.   Cardiovascular: Negative for chest pain and palpitations.  Gastrointestinal: Positive for blood in stool, nausea and  vomiting.  Negative for abdominal pain  Endocrine: Negative for polyphagia and polyuria.  Genitourinary: Negative for decreased urine volume, dysuria, enuresis Musculoskeletal: Negative for arthralgias and back pain.  Skin: Negative for color change and rash.  Allergic/Immunologic: Negative for immunocompromised state.  Neurological: Positive for weakness.  Negative for tremors, syncope, speech difficulty, light-headedness and headaches.  Hematological: Does not bruise/bleed easily.  All other systems reviewed and are negative  Past Medical History:  Diagnosis Date  . Acid reflux   . Anxiety   . Depression   . Hypertension    Past Surgical History:  Procedure Laterality Date  . BIOPSY  05/14/2018   Procedure: BIOPSY;  Surgeon: Vida Rigger, MD;  Location: [facility] ENDOSCOPY;  Service: Endoscopy;;  . COLONOSCOPY    . ESOPHAGOGASTRODUODENOSCOPY (EGD) WITH PROPOFOL N/A 05/12/2018   Procedure: ESOPHAGOGASTRODUODENOSCOPY (EGD) WITH PROPOFOL;  Surgeon: West Bali, MD;  Location: [facility] ENDO SUITE;  Service: Endoscopy;  Laterality: N/A;  . ESOPHAGOGASTRODUODENOSCOPY (EGD) WITH PROPOFOL N/A 05/14/2018   Procedure: ESOPHAGOGASTRODUODENOSCOPY (EGD) WITH PROPOFOL;  Surgeon: Vida Rigger, MD;  Location: [facility] ENDOSCOPY;  Service: Endoscopy;  Laterality: N/A;  start with side-viewing scope  . ESOPHAGOGASTRODUODENOSCOPY (EGD) WITH PROPOFOL N/A 09/23/2018   Procedure: ESOPHAGOGASTRODUODENOSCOPY (EGD) WITH PROPOFOL;  Surgeon: Willis Modena, MD;  Location: [facility] ENDOSCOPY;  Service: Endoscopy;  Laterality: N/A;  endo loop  . EUS N/A 09/23/2018   Procedure: UPPER ENDOSCOPIC ULTRASOUND (EUS) RADIAL;  Surgeon: Willis Modena, MD;  Location: [facility] ENDOSCOPY;  Service: Endoscopy;  Laterality: N/A;  . HERNIA REPAIR    . POLYPECTOMY  05/12/2018   Procedure: POLYPECTOMY;  Surgeon: West Bali, MD;  Location: [facility] ENDO SUITE;  Service: Endoscopy;;  polyp gastric  . POLYPECTOMY  09/23/2018   Procedure: POLYPECTOMY;  Surgeon:  Willis Modena, MD;  Location: [facility] ENDOSCOPY;  Service: Endoscopy;;  . ROBOTIC ASSITED PARTIAL NEPHRECTOMY Right 09/24/2019   Procedure: XI ROBOTIC ASSITED PARTIAL NEPHRECTOMY;  Surgeon: Rene Paci, MD;  Location: [facility] ORS;  Service: Urology;  Laterality: Right;  . SUBMUCOSAL INJECTION  09/23/2018   Procedure: SUBMUCOSAL INJECTION;  Surgeon: Willis Modena, MD;  Location: [facility] ENDOSCOPY;  Service: Endoscopy;;    Social History:  reports that he has never smoked. He has never used smokeless tobacco. He reports current alcohol use. He reports that he does not use drugs.   No Known Allergies  Family History  Problem Relation Age of Onset  . Hypertension Father   . Colon cancer Neg Hx   . Colon polyps Neg Hx     Prior to Admission medications   Medication Sig Start Date End Date Taking? Authorizing Provider  acetaminophen (TYLENOL) 325 MG tablet Take 2 tablets (650 mg total) by mouth every 6 (six) hours as needed for mild pain or moderate pain (Call Dr. Marlane Hatcher office if you need more pain medicine). Patient not taking: Reported on 09/06/2019 04/12/19   Sherrie George, PA-C  ALPRAZolam Prudy Feeler) 1 MG tablet Take 0.5 mg by mouth at bedtime as needed for sleep.  03/13/18   [provider]  docusate sodium (COLACE) 100 MG capsule Take 1 capsule (100 mg total) by mouth 2 (two) times daily. 09/24/19   Harrie Foreman, PA-C  HYDROcodone-acetaminophen (NORCO) 5-325 MG tablet Take 1-2 tablets by mouth every 6 (six) hours as needed for moderate pain. 09/24/19   Harrie Foreman, PA-C  losartan (COZAAR) 100 MG tablet Take 100 mg by mouth daily.  04/08/18   [provider]  pantoprazole (PROTONIX) 40 MG tablet Take 1 tablet (40 mg total) by mouth daily. 05/15/18   Glade Lloyd, MD  promethazine (PHENERGAN) 12.5 MG tablet Take 1 tablet (12.5 mg total) by mouth every 4 (four) hours as needed for nausea or vomiting. 09/24/19   Harrie Foreman, PA-C  Tetrahydrozoline HCl (VISINE OP) Place 1 drop  into both eyes daily as needed (dry eyes).    [provider]    Physical Exam: BP (!) 88/64   Pulse (!) 122   Temp 98 F (36.7 C) (Oral)   Resp 20   Ht 5\' 10"  (1.778 m)   Wt 95.3 kg   SpO2 95%   BMI 30.13 kg/m   . General: 65 y.o. year-old male well developed well nourished in no acute distress.  Alert and oriented x3. 68 HEENT: NCAT, EOMI . Neck: Supple, trachea medial . Cardiovascular: Tachycardia.  Regular rate and rhythm with no rubs or gallops.  No thyromegaly or JVD noted.  2/4 pulses in all 4 extremities. Marland Kitchen Respiratory: Clear to auscultation with no wheezes or rales. Good inspiratory effort. . Abdomen: Soft nontender nondistended with normal bowel sounds x4 quadrants. . Muskuloskeletal: No cyanosis, clubbing or edema noted bilaterally . Neuro: CN II-XII intact, strength 5/5 x 4, sensation intact. . Skin: No ulcerative lesions noted or rashes . Psychiatry: Judgement and insight appear normal. Mood is appropriate for condition and setting          Labs on Admission:  Basic Metabolic Panel: Recent Labs  Lab 05/26/20 1742  NA 139  K 3.1*  CL 104  CO2 24  GLUCOSE 146*  BUN 11  CREATININE 0.85  CALCIUM 8.3*   Liver Function Tests: Recent Labs  Lab 05/26/20 1742  AST 82*  ALT 68*  ALKPHOS 98  BILITOT 1.9*  PROT 6.0*  ALBUMIN 3.3*   No results for input(s): LIPASE, AMYLASE in the last 168 hours. No results for input(s): AMMONIA in the last 168 hours. CBC: Recent Labs  Lab 05/26/20 1742 05/26/20 1923  WBC 10.6* 14.6*  NEUTROABS 7.5 10.7*  HGB 15.4 13.5  HCT 42.9 41.0  MCV 95.3 104.1*  PLT 187 209   Cardiac Enzymes: No results for input(s): CKTOTAL, CKMB, CKMBINDEX, TROPONINI in the last 168 hours.  BNP (last 3 results) No results for input(s): BNP in the last 8760 hours.  ProBNP (last 3 results) No results for input(s): PROBNP in the last 8760 hours.  CBG: No results for input(s): GLUCAP in the last 168 hours.  Radiological Exams  on Admission: No results found.  EKG: I independently viewed the EKG done and my findings are as followed: EKG was not done in the ED   Assessment/Plan Present on Admission: . GI bleed . Hypertension  Principal Problem:   GI bleed Active Problems:   Hypertension   Alcohol abuse   GERD (gastroesophageal reflux disease)   Hypoalbuminemia due to protein-calorie malnutrition (HCC)   Transaminitis   Hyperglycemia   Hematemesis   Nausea & vomiting  Acute GI bleed H/H= 15.4/42.9, this was 14.0/40.9 on 05/26/2020 Hemoccult was positive, he presented with hematemesis (rule out Mallory-Weiss vs esophageal varices) Type and crossmatch was done (B+, Rh -) Continue IV Protonix drip Continue IV octreotide drip (due to upper GI bleed) Gastroenterologist was consulted and will see patient in the morning  Nausea and vomiting Continue IV Zofran 4 mg every 6 hours as needed  Leukocytosis possibly reactive WBC 14.6, no obvious sign of acute infectious process at this time Continue to monitor WBC with morning labs.  Alcohol abuse Last alcohol intake was last night (4/7) Alcohol level < 10, patient was started on CIWA protocol Continue thiamine, folic acid and multivitamin  GERD Continue IV Protonix drip  Transaminitis possibly due to patient's alcohol abuse AST 82, ALT 68, patient denies any abdominal pain Continue to monitor liver enzymes with morning labs  Hyperglycemia possibly reactive CBG 146, continue to monitor blood glucose levels with morning labs  Essential hypertension BP meds will be held at this time due to soft BP  Hypoalbuminemia possibly secondary to mild protein calorie malnutrition Albumin 3.3, protein supplement will be provided when patient resumes oral intake  Obesity (BMI 30.13) Patient was counseled on diet and lifestyle modification  DVT prophylaxis: SCDs  Code Status: Full code  Family Communication: None at bedside  Disposition Plan:  Patient is  from:                        home Anticipated DC to:                   home Anticipated DC date:               2-3 days Anticipated DC barriers:          Patient is unstable to be discharged at this time due to GI bleed and hematemesis which require pending GI consult and inpatient management.  Consults called: Gastroenterology  Admission status: Inpatient    Frankey Shown MD [facility]  05/26/2020, 8:42 PM

## 2020-05-26 NOTE — ED Provider Notes (Signed)
[facility] EMERGENCY DEPARTMENT Provider Note   CSN: [id number] Arrival date & time: 05/26/20  1605     History Chief Complaint  Patient presents with  . GI Bleeding    Marco Stevenson is a 65 y.o. male.  HPI      Marco Stevenson is a 65 y.o. male, with a history of acid reflux, anxiety, depression, HTN, alcohol abuse, presenting to the ED with black stools noted today. Patient had normal stools this morning, but then had 2 episodes of black stools today with the last one approximately 30 minutes prior to arrival.  Accompanied by nausea and some generalized weakness Patient has had this issue before when he had an upper GI bleed. He drinks at least 2 hard liquor drinks per day with last alcohol last night.  Denies use of NSAIDs.  Denies anticoagulation.  Denies fever/chills, abdominal pain, hematochezia, vomiting, chest pain, shortness of breath, syncope, dizziness, or any other complaints.   Past Medical History:  Diagnosis Date  . Acid reflux   . Anxiety   . Depression   . Hypertension     Patient Active Problem List   Diagnosis Date Noted  . GI bleed 05/26/2020  . Renal mass 09/24/2019  . Enteritis 04/10/2019  . Syncopal episodes 05/12/2018  . UGI bleed 05/11/2018  . Hypertension 05/11/2018  . ETOH abuse 05/11/2018  . Acid reflux 05/11/2018  . Hypomagnesemia 05/11/2018  . Acute blood loss anemia 05/11/2018    Past Surgical History:  Procedure Laterality Date  . BIOPSY  05/14/2018   Procedure: BIOPSY;  Surgeon: Vida Rigger, MD;  Location: [facility] ENDOSCOPY;  Service: Endoscopy;;  . COLONOSCOPY    . ESOPHAGOGASTRODUODENOSCOPY (EGD) WITH PROPOFOL N/A 05/12/2018   Procedure: ESOPHAGOGASTRODUODENOSCOPY (EGD) WITH PROPOFOL;  Surgeon: West Bali, MD;  Location: [facility] ENDO SUITE;  Service: Endoscopy;  Laterality: N/A;  . ESOPHAGOGASTRODUODENOSCOPY (EGD) WITH PROPOFOL N/A 05/14/2018   Procedure: ESOPHAGOGASTRODUODENOSCOPY (EGD) WITH PROPOFOL;  Surgeon: Vida Rigger,  MD;  Location: [facility] ENDOSCOPY;  Service: Endoscopy;  Laterality: N/A;  start with side-viewing scope  . ESOPHAGOGASTRODUODENOSCOPY (EGD) WITH PROPOFOL N/A 09/23/2018   Procedure: ESOPHAGOGASTRODUODENOSCOPY (EGD) WITH PROPOFOL;  Surgeon: Willis Modena, MD;  Location: [facility] ENDOSCOPY;  Service: Endoscopy;  Laterality: N/A;  endo loop  . EUS N/A 09/23/2018   Procedure: UPPER ENDOSCOPIC ULTRASOUND (EUS) RADIAL;  Surgeon: Willis Modena, MD;  Location: [facility] ENDOSCOPY;  Service: Endoscopy;  Laterality: N/A;  . HERNIA REPAIR    . POLYPECTOMY  05/12/2018   Procedure: POLYPECTOMY;  Surgeon: West Bali, MD;  Location: [facility] ENDO SUITE;  Service: Endoscopy;;  polyp gastric  . POLYPECTOMY  09/23/2018   Procedure: POLYPECTOMY;  Surgeon: Willis Modena, MD;  Location: [facility] ENDOSCOPY;  Service: Endoscopy;;  . ROBOTIC ASSITED PARTIAL NEPHRECTOMY Right 09/24/2019   Procedure: XI ROBOTIC ASSITED PARTIAL NEPHRECTOMY;  Surgeon: Rene Paci, MD;  Location: [facility] ORS;  Service: Urology;  Laterality: Right;  . SUBMUCOSAL INJECTION  09/23/2018   Procedure: SUBMUCOSAL INJECTION;  Surgeon: Willis Modena, MD;  Location: [facility] ENDOSCOPY;  Service: Endoscopy;;       Family History  Problem Relation Age of Onset  . Hypertension Father   . Colon cancer Neg Hx   . Colon polyps Neg Hx     Social History   Tobacco Use  . Smoking status: Never Smoker  . Smokeless tobacco: Never Used  Vaping Use  . Vaping Use: Never used  Substance Use Topics  . Alcohol use: Yes    Comment:  drank last night.  . Drug use: No    Home Medications Prior to Admission medications   Medication Sig Start Date End Date Taking? Authorizing Provider  acetaminophen (TYLENOL) 325 MG tablet Take 2 tablets (650 mg total) by mouth every 6 (six) hours as needed for mild pain or moderate pain (Call Dr. Marlane Hatcher office if you need more pain medicine). Patient not taking: Reported on 09/06/2019 04/12/19   Sherrie George, PA-Stevenson  ALPRAZolam Prudy Feeler) 1  MG tablet Take 0.5 mg by mouth at bedtime as needed for sleep.  03/13/18   [provider]  docusate sodium (COLACE) 100 MG capsule Take 1 capsule (100 mg total) by mouth 2 (two) times daily. 09/24/19   Harrie Foreman, PA-Stevenson  HYDROcodone-acetaminophen (NORCO) 5-325 MG tablet Take 1-2 tablets by mouth every 6 (six) hours as needed for moderate pain. 09/24/19   Harrie Foreman, PA-Stevenson  losartan (COZAAR) 100 MG tablet Take 100 mg by mouth daily.  04/08/18   [provider]  pantoprazole (PROTONIX) 40 MG tablet Take 1 tablet (40 mg total) by mouth daily. 05/15/18   Glade Lloyd, MD  promethazine (PHENERGAN) 12.5 MG tablet Take 1 tablet (12.5 mg total) by mouth every 4 (four) hours as needed for nausea or vomiting. 09/24/19   Harrie Foreman, PA-Stevenson  Tetrahydrozoline HCl (VISINE OP) Place 1 drop into both eyes daily as needed (dry eyes).    [provider]    Allergies    Patient has no known allergies.  Review of Systems   Review of Systems  Constitutional: Negative for chills, diaphoresis and fever.  Respiratory: Negative for shortness of breath.   Cardiovascular: Negative for chest pain.  Gastrointestinal: Positive for blood in stool and nausea. Negative for abdominal pain and vomiting.  Neurological: Positive for weakness. Negative for dizziness, syncope and numbness.  All other systems reviewed and are negative.   Physical Exam Updated Vital Signs BP (!) 139/119 (BP Location: Right Arm)   Pulse (!) 123   Temp 98 F (36.7 Stevenson) (Oral)   Resp 16   Ht 5\' 10"  (1.778 m)   Wt 95.3 kg   SpO2 96%   BMI 30.13 kg/m   Physical Exam Vitals and nursing note reviewed.  Constitutional:      General: He is not in acute distress.    Appearance: He is well-developed. He is not diaphoretic.  HENT:     Head: Normocephalic and atraumatic.     Mouth/Throat:     Mouth: Mucous membranes are moist.     Pharynx: Oropharynx is clear.  Eyes:     Conjunctiva/sclera: Conjunctivae normal.   Cardiovascular:     Rate and Rhythm: Regular rhythm. Tachycardia present.     Pulses: Normal pulses.          Radial pulses are 2+ on the right side and 2+ on the left side.       Posterior tibial pulses are 2+ on the right side and 2+ on the left side.     Heart sounds: Normal heart sounds.     Comments: Tactile temperature in the extremities appropriate and equal bilaterally. Pulmonary:     Effort: Pulmonary effort is normal. No respiratory distress.     Breath sounds: Normal breath sounds.  Abdominal:     Palpations: Abdomen is soft.     Tenderness: There is no abdominal tenderness. There is no guarding.  Genitourinary:    Rectum: Guaiac result positive.     Comments: Rectal Exam:  No  external hemorrhoids, fissures, or lesions noted.  No frank blood or melena. No stool burden.  No rectal tenderness. No foreign bodies noted.  Musculoskeletal:     Cervical back: Neck supple.     Right lower leg: No edema.     Left lower leg: No edema.  Skin:    General: Skin is warm and dry.  Neurological:     Mental Status: He is alert.  Psychiatric:        Mood and Affect: Mood and affect normal.        Speech: Speech normal.        Behavior: Behavior normal.     ED Results / Procedures / Treatments   Labs (all labs ordered are listed, but only abnormal results are displayed) Labs Reviewed  COMPREHENSIVE METABOLIC PANEL - Abnormal; Notable for the following components:      Result Value   Potassium 3.1 (*)    Glucose, Bld 146 (*)    Calcium 8.3 (*)    Total Protein 6.0 (*)    Albumin 3.3 (*)    AST 82 (*)    ALT 68 (*)    Total Bilirubin 1.9 (*)    All other components within normal limits  CBC WITH DIFFERENTIAL/PLATELET - Abnormal; Notable for the following components:   WBC 10.6 (*)    MCH 34.2 (*)    Monocytes Absolute 1.4 (*)    All other components within normal limits  POC OCCULT BLOOD, ED - Abnormal; Notable for the following components:   Fecal Occult Bld POSITIVE  (*)    All other components within normal limits  RESP PANEL BY RT-PCR (FLU A&B, COVID) ARPGX2  ETHANOL  PROTIME-INR  OCCULT BLOOD X 1 CARD TO LAB, STOOL  CBC WITH DIFFERENTIAL/PLATELET  TYPE AND SCREEN    EKG None  Radiology No results found.  Procedures .Critical Care Performed by: Marco Pancoast, PA-Stevenson Authorized by: Marco Pancoast, PA-Stevenson   Critical care provider statement:    Critical care time (minutes):  45   Critical care time was exclusive of:  Separately billable procedures and treating other patients   Critical care was necessary to treat or prevent imminent or life-threatening deterioration of the following conditions: Severe upper GI bleed.   Critical care was time spent personally by me on the following activities:  Ordering and performing treatments and interventions, ordering and review of laboratory studies, ordering and review of radiographic studies, pulse oximetry, re-evaluation of patient's condition, review of old charts, obtaining history from patient or surrogate, development of treatment plan with patient or surrogate, discussions with consultants, evaluation of patient's response to treatment and examination of patient   I assumed direction of critical care for this patient from another provider in my specialty: no     Care discussed with: admitting provider       Medications Ordered in ED Medications  LORazepam (ATIVAN) injection 0-4 mg (2 mg Intravenous Given 05/26/20 1825)    Or  LORazepam (ATIVAN) tablet 0-4 mg ( Oral See Alternative 05/26/20 1825)  LORazepam (ATIVAN) injection 0-4 mg (has no administration in time range)    Or  LORazepam (ATIVAN) tablet 0-4 mg (has no administration in time range)  thiamine tablet 100 mg ( Oral See Alternative 05/26/20 1825)    Or  thiamine (B-1) injection 100 mg (100 mg Intravenous Given 05/26/20 1825)  pantoprazole (PROTONIX) 80 mg in sodium chloride 0.9 % 100 mL (0.8 mg/mL) infusion (8 mg/hr Intravenous New Bag/Given  05/26/20 1941)  pantoprazole (PROTONIX) injection 40 mg (has no administration in time range)  octreotide (SANDOSTATIN) 2 mcg/mL load via infusion 50 mcg (50 mcg Intravenous Bolus from Bag 05/26/20 1938)    And  octreotide (SANDOSTATIN) 500 mcg in sodium chloride 0.9 % 250 mL (2 mcg/mL) infusion (50 mcg/hr Intravenous New Bag/Given 05/26/20 1937)  ondansetron (ZOFRAN) injection 4 mg (4 mg Intravenous Given 05/26/20 1736)  pantoprazole (PROTONIX) injection 40 mg (40 mg Intravenous Given 05/26/20 1737)  sodium chloride 0.9 % bolus 1,000 mL (1,000 mLs Intravenous New Bag/Given 05/26/20 1844)    ED Course  I have reviewed the triage vital signs and the nursing notes.  Pertinent labs & imaging results that were available during my care of the patient were reviewed by me and considered in my medical decision making (see chart for details).  Clinical Course as of 05/26/20 1954  Fri May 26, 2020  [id number] RN notified me patient began to vomit blood.  Patient pale, diaphoretic, minimally responsive. RN states patient suddenly began vomiting bright red blood.  He vomited approximately 100 cc of bright red blood. Patient began to improve during reassessment.  Began talking again and was oriented. Hypotensive at 76 systolic.  Fluid bolus was started.  Patient's blood pressure rapidly improved with repeat blood pressure at 1850 99 systolic.  Patient continues to deny abdominal pain. [SJ]  1910 Spoke with Dr. Marletta Lorarver, GI.  Agrees with treatment initiated including Protonix and octreotide infusions.  Keep patient n.p.o.  Plan for EGD first thing in the morning. Admit via the hospitalist.  If patient has another episode of bloody emesis, call him back and he will reevaluate need for EGD sooner. [SJ]  1924 Spoke with Dr. Thomes DinningAdefeso, hospitalist.  Agrees to admit the patient.  He states he will come see the patient right away and take over patient care at that time. [SJ]    Clinical Course User Index [SJ] Marco Stevenson, Marco DankerShawn C, PA-Stevenson    MDM Rules/Calculators/A&P                          Patient presents with concern for melanotic stools. No melena on rectal exam, however, Hemoccult positive.  Abdominal exam benign. I personally reviewed and interpreted the patient's labs. Previous EGD from 2020 was reviewed.  Polyp noted with subsequent polypectomy.  No varices or ulcers noted at that time.  Thankfully, hemoglobin was within normal limits upon initial sampling.  Normal BUN. Patient then had an episode of hematemesis during his ED course with subsequent hypotension, however, no sustained mental status change.   Findings and plan of care discussed with attending physician, Marco KaplanAnkit Nanavati, MD. Dr. Rhunette CroftNanavati personally evaluated and examined this patient.    Final Clinical Impression(s) / ED Diagnoses Final diagnoses:  Upper GI bleed    Rx / DC Orders ED Discharge Orders    None       Marco Stevenson, Marco Oros C, PA-Stevenson 05/26/20 1954    Marco KaplanNanavati, Ankit, MD 05/28/20 [phone]

## 2020-05-26 NOTE — ED Triage Notes (Signed)
Pt with black stools since 30 minutes ago.  Hx of same.

## 2020-05-27 ENCOUNTER — Inpatient Hospital Stay (HOSPITAL_COMMUNITY): Payer: 59 | Admitting: Anesthesiology

## 2020-05-27 ENCOUNTER — Encounter (HOSPITAL_COMMUNITY): Payer: Self-pay | Admitting: Internal Medicine

## 2020-05-27 ENCOUNTER — Encounter (HOSPITAL_COMMUNITY)
Admission: EM | Disposition: A | Discharge status: Home / Self Care | Payer: Self-pay | Source: Home / Self Care | Attending: Internal Medicine

## 2020-05-27 DIAGNOSIS — K921 Melena: Secondary | ICD-10-CM

## 2020-05-27 DIAGNOSIS — K920 Hematemesis: Secondary | ICD-10-CM

## 2020-05-27 DIAGNOSIS — K317 Polyp of stomach and duodenum: Principal | ICD-10-CM

## 2020-05-27 HISTORY — PX: POLYPECTOMY: SHX5525

## 2020-05-27 HISTORY — PX: ESOPHAGOGASTRODUODENOSCOPY (EGD) WITH PROPOFOL: SHX5813

## 2020-05-27 LAB — COMPREHENSIVE METABOLIC PANEL
ALT: 47 U/L — ABNORMAL HIGH (ref 0–44)
AST: 46 U/L — ABNORMAL HIGH (ref 15–41)
Albumin: 2.9 g/dL — ABNORMAL LOW (ref 3.5–5.0)
Alkaline Phosphatase: 69 U/L (ref 38–126)
Anion gap: 8 (ref 5–15)
BUN: 18 mg/dL (ref 8–23)
CO2: 26 mmol/L (ref 22–32)
Calcium: 7.7 mg/dL — ABNORMAL LOW (ref 8.9–10.3)
Chloride: 106 mmol/L (ref 98–111)
Creatinine, Ser: 0.9 mg/dL (ref 0.61–1.24)
GFR, Estimated: >60 mL/min (ref >60)
Glucose, Bld: 126 mg/dL — ABNORMAL HIGH (ref 70–99)
Potassium: 3.6 mmol/L (ref 3.5–5.1)
Sodium: 140 mmol/L (ref 135–145)
Total Bilirubin: 1.3 mg/dL — ABNORMAL HIGH (ref 0.3–1.2)
Total Protein: 5.1 g/dL — ABNORMAL LOW (ref 6.5–8.1)

## 2020-05-27 LAB — CBC
HCT: 32.9 % — ABNORMAL LOW (ref 39.0–52.0)
Hemoglobin: 11.3 g/dL — ABNORMAL LOW (ref 13.0–17.0)
MCH: 33.8 pg (ref 26.0–34.0)
MCHC: 34.3 g/dL (ref 30.0–36.0)
MCV: 98.5 fL (ref 80.0–100.0)
Platelets: 165 10*3/uL (ref 150–400)
RBC: 3.34 MIL/uL — ABNORMAL LOW (ref 4.22–5.81)
RDW: 12.2 % (ref 11.5–15.5)
WBC: 10.4 10*3/uL (ref 4.0–10.5)
nRBC: 0 % (ref 0.0–0.2)

## 2020-05-27 LAB — PHOSPHORUS: Phosphorus: 2.6 mg/dL (ref 2.5–4.6)

## 2020-05-27 LAB — APTT: aPTT: 27 seconds (ref 24–36)

## 2020-05-27 LAB — MRSA PCR SCREENING: MRSA by PCR: NEGATIVE

## 2020-05-27 LAB — PROTIME-INR
INR: 1.1 (ref 0.8–1.2)
Prothrombin Time: 13.5 seconds (ref 11.4–15.2)

## 2020-05-27 LAB — HIV ANTIBODY (ROUTINE TESTING W REFLEX): HIV Screen 4th Generation wRfx: NONREACTIVE

## 2020-05-27 LAB — MAGNESIUM: Magnesium: 1.4 mg/dL — ABNORMAL LOW (ref 1.7–2.4)

## 2020-05-27 LAB — GLUCOSE, CAPILLARY
Glucose-Capillary: 129 mg/dL — ABNORMAL HIGH (ref 70–99)
Glucose-Capillary: 115 mg/dL — ABNORMAL HIGH (ref 70–99)

## 2020-05-27 SURGERY — ESOPHAGOGASTRODUODENOSCOPY (EGD) WITH PROPOFOL
Anesthesia: General

## 2020-05-27 MED ORDER — STERILE WATER FOR IRRIGATION IR SOLN
Status: DC | PRN
  Administered 2020-05-27: 200 mL

## 2020-05-27 MED ORDER — ALPRAZOLAM 1 MG PO TABS: 1.0000 mg | ORAL_TABLET | Freq: Every evening | ORAL | Status: DC | PRN

## 2020-05-27 MED ORDER — LOSARTAN POTASSIUM 50 MG PO TABS
100.0000 mg | ORAL_TABLET | Freq: Every day | ORAL | Status: DC
  Administered 2020-05-27 – 2020-05-28 (×2): 100 mg via ORAL
  Filled 2020-05-27 (×2): qty 2

## 2020-05-27 MED ORDER — PROPOFOL 10 MG/ML IV BOLUS
INTRAVENOUS | Status: DC | PRN
  Administered 2020-05-27: 200 mg via INTRAVENOUS
  Administered 2020-05-27: 100 mg via INTRAVENOUS

## 2020-05-27 MED ORDER — HYDROCODONE-ACETAMINOPHEN 5-325 MG PO TABS: 1.0000 | ORAL_TABLET | Freq: Four times a day (QID) | ORAL | Status: DC | PRN

## 2020-05-27 MED ORDER — POLYVINYL ALCOHOL 1.4 % OP SOLN: 1.0000 [drp] | OPHTHALMIC | Status: DC | PRN

## 2020-05-27 MED ORDER — MAGNESIUM SULFATE 2 GM/50ML IV SOLN
2.0000 g | Freq: Once | INTRAVENOUS | Status: AC
  Administered 2020-05-27: 2 g via INTRAVENOUS
  Filled 2020-05-27: qty 50

## 2020-05-27 MED ORDER — PANTOPRAZOLE SODIUM 40 MG IV SOLR
INTRAVENOUS | Status: AC
  Filled 2020-05-27: qty 80

## 2020-05-27 MED ORDER — PROPOFOL 10 MG/ML IV BOLUS
INTRAVENOUS | Status: AC
  Filled 2020-05-27: qty 20

## 2020-05-27 MED ORDER — PANTOPRAZOLE SODIUM 40 MG PO TBEC
40.0000 mg | DELAYED_RELEASE_TABLET | Freq: Two times a day (BID) | ORAL | Status: DC
  Administered 2020-05-27 – 2020-05-28 (×3): 40 mg via ORAL
  Filled 2020-05-27 (×3): qty 1

## 2020-05-27 MED ORDER — SODIUM CHLORIDE 0.9 % IV SOLN: INTRAVENOUS | Status: DC | PRN

## 2020-05-27 MED ORDER — TETRAHYDROZOLINE HCL 0.05 % OP SOLN
1.0000 [drp] | Freq: Every day | OPHTHALMIC | Status: DC | PRN
  Filled 2020-05-27: qty 15

## 2020-05-27 NOTE — Progress Notes (Signed)
Pt taken down for procedure.  

## 2020-05-27 NOTE — Consult Note (Signed)
Consulting  Provider: Dr. Thomes Dinning Primary Care Physician:  Gareth Morgan, MD Primary Gastroenterologist:  Dr. Jena Gauss  Reason for Consultation:  Melena, hematemesis  HPI:  Marco Stevenson is a 65 y.o. male with a past medical history of hypertension, GERD, chronic alcohol abuse, presented to [name], ER yesterday evening due to melena patient notes 2 episodes of melena yesterday afternoon.  Also notes associated generalized weakness/fatigue.  While being worked up in the ER, he reportedly had one episode of hematemesis approximately 100 cc per ER physician.  He has been hemodynamically stable.  Hemoglobin has dropped from initial 15.4-11.3 this morning.  Platelets 165, INR 1.1.  No documented history of cirrhosis.  Does note drinking approximately 4 shots of liquor daily.  No abdominal pain or discomfort.  Last EGD 09/23/2018 for duodenal polyp which was removed.  Pathology showed juvenile polyp.  Does note a history of NSAID use but not currently.  Patient takes pantoprazole 40 mg daily at home.  Patient has been started on IV Protonix as well as octreotide.  Past Medical History:  Diagnosis Date  . Acid reflux   . Anxiety   . Depression   . Hypertension     Past Surgical History:  Procedure Laterality Date  . BIOPSY  05/14/2018   Procedure: BIOPSY;  Surgeon: Vida Rigger, MD;  Location: [facility] ENDOSCOPY;  Service: Endoscopy;;  . COLONOSCOPY    . ESOPHAGOGASTRODUODENOSCOPY (EGD) WITH PROPOFOL N/A 05/12/2018   Procedure: ESOPHAGOGASTRODUODENOSCOPY (EGD) WITH PROPOFOL;  Surgeon: West Bali, MD;  Location: [facility] ENDO SUITE;  Service: Endoscopy;  Laterality: N/A;  . ESOPHAGOGASTRODUODENOSCOPY (EGD) WITH PROPOFOL N/A 05/14/2018   Procedure: ESOPHAGOGASTRODUODENOSCOPY (EGD) WITH PROPOFOL;  Surgeon: Vida Rigger, MD;  Location: [facility] ENDOSCOPY;  Service: Endoscopy;  Laterality: N/A;  start with side-viewing scope  . ESOPHAGOGASTRODUODENOSCOPY (EGD) WITH PROPOFOL N/A 09/23/2018   Procedure:  ESOPHAGOGASTRODUODENOSCOPY (EGD) WITH PROPOFOL;  Surgeon: Willis Modena, MD;  Location: [facility] ENDOSCOPY;  Service: Endoscopy;  Laterality: N/A;  endo loop  . EUS N/A 09/23/2018   Procedure: UPPER ENDOSCOPIC ULTRASOUND (EUS) RADIAL;  Surgeon: Willis Modena, MD;  Location: [facility] ENDOSCOPY;  Service: Endoscopy;  Laterality: N/A;  . HERNIA REPAIR    . POLYPECTOMY  05/12/2018   Procedure: POLYPECTOMY;  Surgeon: West Bali, MD;  Location: [facility] ENDO SUITE;  Service: Endoscopy;;  polyp gastric  . POLYPECTOMY  09/23/2018   Procedure: POLYPECTOMY;  Surgeon: Willis Modena, MD;  Location: [facility] ENDOSCOPY;  Service: Endoscopy;;  . ROBOTIC ASSITED PARTIAL NEPHRECTOMY Right 09/24/2019   Procedure: XI ROBOTIC ASSITED PARTIAL NEPHRECTOMY;  Surgeon: Rene Paci, MD;  Location: [facility] ORS;  Service: Urology;  Laterality: Right;  . SUBMUCOSAL INJECTION  09/23/2018   Procedure: SUBMUCOSAL INJECTION;  Surgeon: Willis Modena, MD;  Location: [facility] ENDOSCOPY;  Service: Endoscopy;;    Prior to Admission medications   Medication Sig Start Date End Date Taking? Authorizing Provider  acetaminophen (TYLENOL) 325 MG tablet Take 2 tablets (650 mg total) by mouth every 6 (six) hours as needed for mild pain or moderate pain (Call Dr. Marlane Hatcher office if you need more pain medicine). 04/12/19  Yes Sherrie George, PA-C  ALPRAZolam Prudy Feeler) 1 MG tablet Take 1 mg by mouth at bedtime as needed for sleep. 03/13/18  Yes [provider]  losartan (COZAAR) 100 MG tablet Take 100 mg by mouth daily.  04/08/18  Yes [provider]  pantoprazole (PROTONIX) 40 MG tablet Take 1 tablet (40 mg total) by mouth daily. 05/15/18  Yes Glade Lloyd, MD  Tetrahydrozoline HCl (VISINE OP) Place 1 drop into both eyes daily as needed (dry eyes).   Yes [provider]  docusate sodium (COLACE) 100 MG capsule Take 1 capsule (100 mg total) by mouth 2 (two) times daily. 09/24/19   Harrie Foreman, PA-C  HYDROcodone-acetaminophen (NORCO)  5-325 MG tablet Take 1-2 tablets by mouth every 6 (six) hours as needed for moderate pain. 09/24/19   Harrie Foreman, PA-C  promethazine (PHENERGAN) 12.5 MG tablet Take 1 tablet (12.5 mg total) by mouth every 4 (four) hours as needed for nausea or vomiting. 09/24/19   Harrie Foreman, PA-C    Current Facility-Administered Medications  Medication Dose Route Frequency Provider Last Rate Last Admin  . folic acid (FOLVITE) tablet 1 mg  1 mg Oral Daily Adefeso, Oladapo, DO      . LORazepam (ATIVAN) injection 0-4 mg  0-4 mg Intravenous Q6H Adefeso, Oladapo, DO   2 mg at 05/26/20 1825   Or  . LORazepam (ATIVAN) tablet 0-4 mg  0-4 mg Oral Q6H Adefeso, Oladapo, DO      . [START ON 05/29/2020] LORazepam (ATIVAN) injection 0-4 mg  0-4 mg Intravenous Q12H Adefeso, Oladapo, DO       Or  . [START ON 05/29/2020] LORazepam (ATIVAN) tablet 0-4 mg  0-4 mg Oral Q12H Adefeso, Oladapo, DO      . magnesium sulfate IVPB 2 g 50 mL  2 g Intravenous Once Sherryll Burger, Pratik D, DO      . multivitamin with minerals tablet 1 tablet  1 tablet Oral Daily Adefeso, Oladapo, DO      . octreotide (SANDOSTATIN) 500 mcg in sodium chloride 0.9 % 250 mL (2 mcg/mL) infusion  50 mcg/hr Intravenous Continuous Adefeso, Oladapo, DO 25 mL/hr at 05/27/20 0120 50 mcg/hr at 05/27/20 0120  . ondansetron (ZOFRAN) injection 4 mg  4 mg Intravenous Q6H PRN Adefeso, Oladapo, DO   4 mg at 05/27/20 0802  . pantoprazole (PROTONIX) 80 mg in sodium chloride 0.9 % 100 mL (0.8 mg/mL) infusion  8 mg/hr Intravenous Continuous Adefeso, Oladapo, DO 10 mL/hr at 05/27/20 0602 8 mg/hr at 05/27/20 0602  . thiamine tablet 100 mg  100 mg Oral Daily Adefeso, Oladapo, DO       Or  . thiamine (B-1) injection 100 mg  100 mg Intravenous Daily Adefeso, Oladapo, DO   100 mg at 05/26/20 1825    Allergies as of 05/26/2020  . (No Known Allergies)    Family History  Problem Relation Age of Onset  . Hypertension Father   . Colon cancer Neg Hx   . Colon polyps Neg Hx     Social  History   Socioeconomic History  . Marital status: Divorced    Spouse name: Not on file  . Number of children: Not on file  . Years of education: Not on file  . Highest education level: Not on file  Occupational History  . Not on file  Tobacco Use  . Smoking status: Never Smoker  . Smokeless tobacco: Never Used  Vaping Use  . Vaping Use: Never used  Substance and Sexual Activity  . Alcohol use: Yes    Comment: drank last night.  . Drug use: No  . Sexual activity: Yes  Other Topics Concern  . Not on file  Social History Narrative  . Not on file   Social Determinants of Health   Financial Resource Strain: Not on file  Food Insecurity: Not on file  Transportation Needs: Not on file  Physical  Activity: Not on file  Stress: Not on file  Social Connections: Not on file  Intimate Partner Violence: Not on file    Review of Systems: General: Negative for anorexia, weight loss, fever, chills. Does note fatigue, weakness. Eyes: Negative for vision changes.  ENT: Negative for hoarseness, difficulty swallowing , nasal congestion. CV: Negative for chest pain, angina, palpitations, dyspnea on exertion, peripheral edema.  Respiratory: Negative for dyspnea at rest, dyspnea on exertion, cough, sputum, wheezing.  GI: See history of present illness. GU:  Negative for dysuria, hematuria, urinary incontinence, urinary frequency, nocturnal urination.  MS: Negative for joint pain, low back pain.  Derm: Negative for rash or itching.  Neuro: Negative for weakness, abnormal sensation, seizure, frequent headaches, memory loss, confusion.  Psych: Negative for anxiety, depression Endo: Negative for unusual weight change.  Heme: Negative for bruising or bleeding. Allergy: Negative for rash or hives.  Physical Exam: Vital signs in last 24 hours: Temp:  [98 F (36.7 C)-98.5 F (36.9 C)] 98.5 F (36.9 C) (04/09 0700) Pulse Rate:  [68-123] 83 (04/09 0700) Resp:  [14-22] 19 (04/09 0500) BP:  (88-139)/(58-119) 125/82 (04/09 0700) SpO2:  [93 %-100 %] 95 % (04/09 0500) Weight:  [95.3 kg] 95.3 kg (04/08 1629) Last BM Date: 05/27/20 General:   Alert,  Well-developed, well-nourished, pleasant and cooperative in NAD Head:  Normocephalic and atraumatic. Eyes:  Sclera clear, no icterus.   Conjunctiva pink. Ears:  Normal auditory acuity. Nose:  No deformity, discharge,  or lesions. Mouth:  No deformity or lesions, dentition normal. Neck:  Supple; no masses or thyromegaly. Lungs:  Clear throughout to auscultation.   No wheezes, crackles, or rhonchi. No acute distress. Heart:  Regular rate and rhythm; no murmurs, clicks, rubs,  or gallops. Abdomen:  Soft, nontender and nondistended. No masses, hepatosplenomegaly or hernias noted. Normal bowel sounds, without guarding, and without rebound.   Rectal:  Deferred until time of colonoscopy.   Msk:  Symmetrical without gross deformities. Normal posture. Pulses:  Normal pulses noted. Extremities:  Without clubbing or edema. Neurologic:  Alert and  oriented x4;  grossly normal neurologically. Skin:  Intact without significant lesions or rashes. Cervical Nodes:  No significant cervical adenopathy. Psych:  Alert and cooperative. Normal mood and affect.  Intake/Output from previous day: 04/08 0701 - 04/09 0700 In: 1183.2 [I.V.:183.2; IV Piggyback:1000] Out: 400 [Urine:400] Intake/Output this shift: No intake/output data recorded.  Lab Results: Recent Labs    05/26/20 1742 05/26/20 1923 05/27/20 0442  WBC 10.6* 14.6* 10.4  HGB 15.4 13.5 11.3*  HCT 42.9 41.0 32.9*  PLT 187 209 165   BMET Recent Labs    05/26/20 1742 05/27/20 0442  NA 139 140  K 3.1* 3.6  CL 104 106  CO2 24 26  GLUCOSE 146* 126*  BUN 11 18  CREATININE 0.85 0.90  CALCIUM 8.3* 7.7*   LFT Recent Labs    05/26/20 1742 05/27/20 0442  PROT 6.0* 5.1*  ALBUMIN 3.3* 2.9*  AST 82* 46*  ALT 68* 47*  ALKPHOS 98 69  BILITOT 1.9* 1.3*   PT/INR Recent Labs     05/26/20 1742 05/27/20 0442  LABPROT 12.9 13.5  INR 1.0 1.1   Hepatitis Panel No results for input(s): HEPBSAG, HCVAB, HEPAIGM, HEPBIGM in the last 72 hours. C-Diff No results for input(s): CDIFFTOX in the last 72 hours.  Studies/Results: No results found.  Impression: *Upper GI bleeding-source unclear *Acute blood loss anemia due to above *Chronic alcohol abuse *GERD  Plan: Etiology of  patient's upper GI bleeding unclear.  Differential includes peptic ulcer disease, gastritis, duodenitis, AVMs, Dieulafoy lesion, polyp, malignancy, gastric/esophageal varices, or other.  We will proceed with EGD to further evaluate. The risks including infection, bleed, or perforation as well as benefits, limitations, alternatives and imponderables have been reviewed with the patient. Potential for esophageal dilation, biopsy, etc. have also been reviewed.  Questions have been answered. All parties agreeable.  Agree with IV Protonix and octreotide.  Continue to monitor hemoglobin transfuse for less than 7 or evidence of bleeding or hemodynamic instability  Further recommendations to follow  Hennie Duosharles K. Marletta Lorarver, D.O. Gastroenterology and Hepatology [facility]    LOS: 1 day     05/27/2020, 8:31 AM

## 2020-05-27 NOTE — Anesthesia Preprocedure Evaluation (Signed)
Anesthesia Evaluation  Patient identified by MRN, date of birth, ID band Patient awake    Reviewed: Allergy & Precautions, H&P , NPO status , Patient's Chart, lab work & pertinent test results, reviewed documented beta blocker date and time   Airway Mallampati: II  TM Distance: >3 FB Neck ROM: full    Dental no notable dental hx. (+) Teeth Intact   Pulmonary neg pulmonary ROS,    Pulmonary exam normal breath sounds clear to auscultation       Cardiovascular Exercise Tolerance: Good hypertension, negative cardio ROS   Rhythm:regular Rate:Normal     Neuro/Psych PSYCHIATRIC DISORDERS Anxiety Depression negative neurological ROS     GI/Hepatic Neg liver ROS, GERD  Medicated,  Endo/Other  negative endocrine ROS  Renal/GU negative Renal ROS  negative genitourinary   Musculoskeletal   Abdominal   Peds  Hematology  (+) Blood dyscrasia, anemia ,   Anesthesia Other Findings   Reproductive/Obstetrics negative OB ROS                             Anesthesia Physical Anesthesia Plan  ASA: III and emergent  Anesthesia Plan: General   Post-op Pain Management:    Induction:   PONV Risk Score and Plan: Propofol infusion  Airway Management Planned:   Additional Equipment:   Intra-op Plan:   Post-operative Plan:   Informed Consent: I have reviewed the patients History and Physical, chart, labs and discussed the procedure including the risks, benefits and alternatives for the proposed anesthesia with the patient or authorized representative who has indicated his/her understanding and acceptance.     Dental Advisory Given  Plan Discussed with: CRNA  Anesthesia Plan Comments:         Anesthesia Quick Evaluation

## 2020-05-27 NOTE — Anesthesia Postprocedure Evaluation (Signed)
Anesthesia Post Note  Patient: Marco Stevenson  Procedure(s) Performed: ESOPHAGOGASTRODUODENOSCOPY (EGD) WITH PROPOFOL (N/A ) POLYPECTOMY  Patient location during evaluation: Phase II Anesthesia Type: General Level of consciousness: awake Pain management: pain level controlled Vital Signs Assessment: post-procedure vital signs reviewed and stable Respiratory status: spontaneous breathing and respiratory function stable Cardiovascular status: blood pressure returned to baseline and stable Postop Assessment: no headache and no apparent nausea or vomiting Anesthetic complications: no Comments: Late entry   No complications documented.   Last Vitals:  Vitals:   05/27/20 0935 05/27/20 1100  BP: 129/84   Pulse: 79   Resp: 16   Temp:  36.6 C  SpO2: 98%     Last Pain:  Vitals:   05/27/20 1100  TempSrc: Oral  PainSc:                  Windell Norfolk

## 2020-05-27 NOTE — Op Note (Signed)
[facility] Patient Name: Marco Stevenson Procedure Date: 05/27/2020 8:51 AM MRN: [id number] Date of Birth: 1955-10-27 Attending MD: Elon Alas. Abbey Chatters DO CSN: [id number] Age: 65 Admit Type: Outpatient Procedure:                Upper GI endoscopy Indications:              Hematemesis, Melena Providers:                Elon Alas. Abbey Chatters, DO, Lurline Del, RN, Wynonia Musty Tech, Technician Referring MD:              Medicines:                See the Anesthesia note for documentation of the                            administered medications Complications:            No immediate complications. Estimated Blood Loss:     Estimated blood loss was minimal. Procedure:                Pre-Anesthesia Assessment:                           - The anesthesia plan was to use monitored                            anesthesia care (MAC).                           After obtaining informed consent, the endoscope was                            passed under direct vision. Throughout the                            procedure, the patient's blood pressure, pulse, and                            oxygen saturations were monitored continuously. The                            GIF-H190 ([id number]) scope was introduced through the                            mouth, and advanced to the second part of duodenum.                            The upper GI endoscopy was accomplished without                            difficulty. The patient tolerated the procedure                            well. Scope In: 9:04:32 AM  Scope Out: 9:16:05 AM Total Procedure Duration: 0 hours 11 minutes 33 seconds  Findings:      There is no endoscopic evidence of bleeding, esophagitis, ulcerations or       varices in the entire esophagus.      Multiple pedunculated and sessile polyps with stigmata of recent       bleeding were found in the gastric fundus. Largest polyp pedunculated       with active oozing.  Preparation was made for polypectomy. To prevent       post polypectomy bleeding, one hemostatic clip was successfully placed       (MR conditional) on the base of the stock. The polyp was then removed       with a hot snare. Resection and retrieval were complete with Jabier Mutton net.       No bleeding after polypectomy noted.      The duodenal bulb, first portion of the duodenum and second portion of       the duodenum were normal. Impression:               - Multiple gastric polyps. Resected and retrieved.                            Clip (MR conditional) was placed.                           - Normal duodenal bulb, first portion of the                            duodenum and second portion of the duodenum. Moderate Sedation:      Per Anesthesia Care Recommendation:           - Return patient to hospital ward for ongoing care.                           - Soft diet.                           - Use a proton pump inhibitor PO BID.                           - Monitor hgb daily.                           - Repeat EGD in 6-8 weeks in the outpatient setting.                           - Await pathology results.                           - Avoid all alcohol Procedure Code(s):        --- Professional ---                           [phone], Esophagogastroduodenoscopy, flexible,                            transoral; with removal of tumor(s), polyp(s), or  other lesion(s) by snare technique Diagnosis Code(s):        --- Professional ---                           K31.7, Polyp of stomach and duodenum                           K92.0, Hematemesis                           K92.1, Melena (includes Hematochezia) CPT copyright 2019 [employer]. All rights reserved. The codes documented in this report are preliminary and upon coder review may  be revised to meet current compliance requirements. Elon Alas. Abbey Chatters, DO [place] Abbey Chatters, DO 05/27/2020 9:27:10 AM This report  has been signed electronically. Number of Addenda: 0

## 2020-05-27 NOTE — Progress Notes (Signed)
Received report from [place], Charity fundraiser. Pt resting comfortably in bed in no acute distress. A&O. VSS. CIWA negative. Denies pain. Tolerating diet. No N/V. Lungs clear. Bowel sounds active. LBM 05/26/20. Call bell within reach. Will continue to monitor.

## 2020-05-27 NOTE — Progress Notes (Signed)
PROGRESS NOTE    Marco Stevenson  VZD:[id number] DOB: 1956-01-08 DOA: 05/26/2020 PCP: Gareth Morgan, MD   Brief Narrative:   Marco Stevenson is a 65 y.o. male with medical history significant for hypertension, GERD, alcohol abuse, anxiety, depression who presents to the emergency department due to black stools noted at home today.  He was also noted to have an episode of hematemesis in the ED.  He underwent EGD on 4/9 with multiple gastric polyps, 1 of which was noted to be oozing blood and was removed.  Assessment & Plan:   Principal Problem:   GI bleed Active Problems:   Hypertension   Alcohol abuse   GERD (gastroesophageal reflux disease)   Hypoalbuminemia due to protein-calorie malnutrition (HCC)   Transaminitis   Hyperglycemia   Hematemesis   Nausea & vomiting   Obesity (BMI 30.0-34.9)   Acute upper GI bleed secondary to bleeding gastric polyp -Continue to monitor CBC and transfuse as needed -Anticipate discharge if hemoglobin remains stable -Continue PPI oral twice daily -Soft diet initiated -Octreotide drip discontinued -GI to follow-up outpatient with EGD repeat in 6-8 weeks -Anticipate discharge in a.m. if hemoglobin remains stable  History of alcohol abuse -Counseled on cessation -CIWA protocol  GERD -PPI twice daily  Transaminitis -Likely secondary to alcohol abuse -We will need outpatient follow-up  Essential hypertension -Resume home BP medications  Obesity -Lifestyle changes   DVT prophylaxis: SCDs Code Status: Full Family Communication: None at bedside Disposition Plan:  Status is: Inpatient  Remains inpatient appropriate because:IV treatments appropriate due to intensity of illness or inability to take PO and Inpatient level of care appropriate due to severity of illness   Dispo: The patient is from: Home              Anticipated d/c is to: Home              Patient currently is not medically stable to d/c.   Difficult to place  patient No  Consultants:   GI  Procedures:   EGD with polypectomy 4/9  Antimicrobials:   None   Subjective: Patient seen and evaluated today with no further nausea or vomiting or abdominal pain noted this morning.  No melanotic stool.  Objective: Vitals:   05/27/20 0700 05/27/20 0800 05/27/20 0920 05/27/20 0935  BP: 125/82 (!) 148/99 (!) 145/84 129/84  Pulse: 83 74 85 79  Resp:  13 17 16   Temp: 98.5 F (36.9 C)  98.6 F (37 C)   TempSrc: Oral     SpO2:  97% 98% 98%  Weight:      Height:        Intake/Output Summary (Last 24 hours) at 05/27/2020 1014 Last data filed at 05/27/2020 0500 Gross per 24 hour  Intake 1183.18 ml  Output 400 ml  Net 783.18 ml   Filed Weights   05/26/20 1629  Weight: 95.3 kg    Examination:  General exam: Appears calm and comfortable  Respiratory system: Clear to auscultation. Respiratory effort normal. Cardiovascular system: S1 & S2 heard, RRR.  Gastrointestinal system: Abdomen is soft Central nervous system: Alert and awake Extremities: No edema Skin: No significant lesions noted Psychiatry: Flat affect.    Data Reviewed: I have personally reviewed following labs and imaging studies  CBC: Recent Labs  Lab 05/26/20 1742 05/26/20 1923 05/27/20 0442  WBC 10.6* 14.6* 10.4  NEUTROABS 7.5 10.7*  --   HGB 15.4 13.5 11.3*  HCT 42.9 41.0 32.9*  MCV 95.3 104.1* 98.5  PLT 187 209 165   Basic Metabolic Panel: Recent Labs  Lab 05/26/20 1742 05/27/20 0442  NA 139 140  K 3.1* 3.6  CL 104 106  CO2 24 26  GLUCOSE 146* 126*  BUN 11 18  CREATININE 0.85 0.90  CALCIUM 8.3* 7.7*  MG  --  1.4*  PHOS  --  2.6   GFR: Estimated Creatinine Clearance: 98.6 mL/min (by C-G formula based on SCr of 0.9 mg/dL). Liver Function Tests: Recent Labs  Lab 05/26/20 1742 05/27/20 0442  AST 82* 46*  ALT 68* 47*  ALKPHOS 98 69  BILITOT 1.9* 1.3*  PROT 6.0* 5.1*  ALBUMIN 3.3* 2.9*   No results for input(s): LIPASE, AMYLASE in the last  168 hours. No results for input(s): AMMONIA in the last 168 hours. Coagulation Profile: Recent Labs  Lab 05/26/20 1742 05/27/20 0442  INR 1.0 1.1   Cardiac Enzymes: No results for input(s): CKTOTAL, CKMB, CKMBINDEX, TROPONINI in the last 168 hours. BNP (last 3 results) No results for input(s): PROBNP in the last 8760 hours. HbA1C: No results for input(s): HGBA1C in the last 72 hours. CBG: Recent Labs  Lab 05/27/20 0805  GLUCAP 129*   Lipid Profile: No results for input(s): CHOL, HDL, LDLCALC, TRIG, CHOLHDL, LDLDIRECT in the last 72 hours. Thyroid Function Tests: No results for input(s): TSH, T4TOTAL, FREET4, T3FREE, THYROIDAB in the last 72 hours. Anemia Panel: No results for input(s): VITAMINB12, FOLATE, FERRITIN, TIBC, IRON, RETICCTPCT in the last 72 hours. Sepsis Labs: No results for input(s): PROCALCITON, LATICACIDVEN in the last 168 hours.  Recent Results (from the past 240 hour(s))  Resp Panel by RT-PCR (Flu A&B, Covid) Nasopharyngeal Swab     Status: None   Collection Time: 05/26/20  7:15 PM   Specimen: Nasopharyngeal Swab; Nasopharyngeal(NP) swabs in vial transport medium  Result Value Ref Range Status   SARS Coronavirus 2 by RT PCR NEGATIVE NEGATIVE Final    Comment: (NOTE) SARS-CoV-2 target nucleic acids are NOT DETECTED.  The SARS-CoV-2 RNA is generally detectable in upper respiratory specimens during the acute phase of infection. The lowest concentration of SARS-CoV-2 viral copies this assay can detect is 138 copies/mL. A negative result does not preclude SARS-Cov-2 infection and should not be used as the sole basis for treatment or other patient management decisions. A negative result may occur with  improper specimen collection/handling, submission of specimen other than nasopharyngeal swab, presence of viral mutation(s) within the areas targeted by this assay, and inadequate number of viral copies(<138 copies/mL). A negative result must be combined  with clinical observations, patient history, and epidemiological information. The expected result is Negative.  Fact Sheet for Patients:  BloggerCourse.com  Fact Sheet for Healthcare Providers:  SeriousBroker.it  This test is no t yet approved or cleared by the [place] FDA and  has been authorized for detection and/or diagnosis of SARS-CoV-2 by FDA under an Emergency Use Authorization (EUA). This EUA will remain  in effect (meaning this test can be used) for the duration of the COVID-19 declaration under Section 564(b)(1) of the Act, 21 U.S.C.section 360bbb-3(b)(1), unless the authorization is terminated  or revoked sooner.       Influenza A by PCR NEGATIVE NEGATIVE Final   Influenza B by PCR NEGATIVE NEGATIVE Final    Comment: (NOTE) The Xpert Xpress SARS-CoV-2/FLU/RSV plus assay is intended as an aid in the diagnosis of influenza from Nasopharyngeal swab specimens and should not be used as a sole basis for treatment. Nasal washings and aspirates  are unacceptable for Xpert Xpress SARS-CoV-2/FLU/RSV testing.  Fact Sheet for Patients: BloggerCourse.com  Fact Sheet for Healthcare Providers: SeriousBroker.it  This test is not yet approved or cleared by the [place] FDA and has been authorized for detection and/or diagnosis of SARS-CoV-2 by FDA under an Emergency Use Authorization (EUA). This EUA will remain in effect (meaning this test can be used) for the duration of the COVID-19 declaration under Section 564(b)(1) of the Act, 21 U.S.C. section 360bbb-3(b)(1), unless the authorization is terminated or revoked.  Performed at [facility], [address] [place], [place] [address]   MRSA PCR Screening     Status: None   Collection Time: 05/26/20  9:30 PM   Specimen: Nasal Mucosa; Nasopharyngeal  Result Value Ref Range Status   MRSA by PCR NEGATIVE NEGATIVE Final     Comment:        The GeneXpert MRSA Assay (FDA approved for NASAL specimens only), is one component of a comprehensive MRSA colonization surveillance program. It is not intended to diagnose MRSA infection nor to guide or monitor treatment for MRSA infections. Performed at [facility], [address], [place], [place] [address]          Radiology Studies: No results found.      Scheduled Meds: . folic acid  1 mg Oral Daily  . LORazepam  0-4 mg Intravenous Q6H   Or  . LORazepam  0-4 mg Oral Q6H  . [START ON 05/29/2020] LORazepam  0-4 mg Intravenous Q12H   Or  . [START ON 05/29/2020] LORazepam  0-4 mg Oral Q12H  . multivitamin with minerals  1 tablet Oral Daily  . pantoprazole  40 mg Oral BID  . thiamine  100 mg Oral Daily   Or  . thiamine  100 mg Intravenous Daily   Continuous Infusions: . magnesium sulfate bolus IVPB       LOS: 1 day    Time spent: 35 minutes    Blakelee Allington Hoover Brunette, DO [facility]  If 7PM-7AM, please contact night-coverage [email or web address] 05/27/2020, 10:14 AM

## 2020-05-27 NOTE — Progress Notes (Signed)
Pt has had 2 bloody BM's since arriving to dept 300. Dr. Sherryll Burger notified. Will continue to monitor.

## 2020-05-27 NOTE — Transfer of Care (Signed)
Immediate Anesthesia Transfer of Care Note  Patient: Marco Stevenson  Procedure(s) Performed: ESOPHAGOGASTRODUODENOSCOPY (EGD) WITH PROPOFOL (N/A )  Patient Location: PACU  Anesthesia Type:General  Level of Consciousness: awake, alert  and oriented  Airway & Oxygen Therapy: Patient Spontanous Breathing  Post-op Assessment: Report given to RN and Post -op Vital signs reviewed and stable  Post vital signs: Reviewed and stable  Last Vitals:  Vitals Value Taken Time  BP    Temp    Pulse    Resp    SpO2      Last Pain:  Vitals:   05/27/20 0849  TempSrc:   PainSc: 0-No pain         Complications: No complications documented.

## 2020-05-28 LAB — MAGNESIUM: Magnesium: 1.8 mg/dL (ref 1.7–2.4)

## 2020-05-28 LAB — BASIC METABOLIC PANEL
Anion gap: 8 (ref 5–15)
BUN: 11 mg/dL (ref 8–23)
CO2: 26 mmol/L (ref 22–32)
Calcium: 7.7 mg/dL — ABNORMAL LOW (ref 8.9–10.3)
Chloride: 106 mmol/L (ref 98–111)
Creatinine, Ser: 0.84 mg/dL (ref 0.61–1.24)
GFR, Estimated: >60 mL/min (ref >60)
Glucose, Bld: 105 mg/dL — ABNORMAL HIGH (ref 70–99)
Potassium: 3.4 mmol/L — ABNORMAL LOW (ref 3.5–5.1)
Sodium: 140 mmol/L (ref 135–145)

## 2020-05-28 LAB — CBC
HCT: 30.9 % — ABNORMAL LOW (ref 39.0–52.0)
Hemoglobin: 10.4 g/dL — ABNORMAL LOW (ref 13.0–17.0)
MCH: 33.9 pg (ref 26.0–34.0)
MCHC: 33.7 g/dL (ref 30.0–36.0)
MCV: 100.7 fL — ABNORMAL HIGH (ref 80.0–100.0)
Platelets: 142 10*3/uL — ABNORMAL LOW (ref 150–400)
RBC: 3.07 MIL/uL — ABNORMAL LOW (ref 4.22–5.81)
RDW: 12.2 % (ref 11.5–15.5)
WBC: 7.6 10*3/uL (ref 4.0–10.5)
nRBC: 0.3 % — ABNORMAL HIGH (ref 0.0–0.2)

## 2020-05-28 MED ORDER — POTASSIUM CHLORIDE CRYS ER 20 MEQ PO TBCR
40.0000 meq | EXTENDED_RELEASE_TABLET | Freq: Once | ORAL | Status: AC
  Administered 2020-05-28: 40 meq via ORAL
  Filled 2020-05-28: qty 2

## 2020-05-28 MED ORDER — PANTOPRAZOLE SODIUM 40 MG PO TBEC: 40.0000 mg | DELAYED_RELEASE_TABLET | Freq: Two times a day (BID) | ORAL | 2 refills | Status: DC

## 2020-05-28 MED ORDER — CHLORDIAZEPOXIDE HCL 10 MG PO CAPS: ORAL_CAPSULE | ORAL | 0 refills | Status: AC

## 2020-05-28 MED ORDER — THIAMINE HCL 100 MG PO TABS: 100.0000 mg | ORAL_TABLET | Freq: Every day | ORAL | 0 refills | Status: AC

## 2020-05-28 NOTE — Discharge Summary (Signed)
Physician Discharge Summary  LADERRICK WILK [id number]:[id number] DOB: 07/22/1955 DOA: 05/26/2020  PCP: Gareth Morgan, MD  Admit date: 05/26/2020  Discharge date: 05/28/2020  Admitted From:Home  Disposition:  Home  Recommendations for Outpatient Follow-up:  1. Follow up with PCP in 1-2 weeks 2. F/U with GI in 6-8 weeks for repeat EGD, which will be arranged 3. Continue on PPI BID 4. Continue Librium taper as scheduled and avoid further alcohol use  Home Health: None  Equipment/Devices: None  Discharge Condition:Stable  CODE STATUS: Full  Diet recommendation: Heart Healthy  Brief/Interim Summary:  Marco Mori Ellingtonis a 65 y.o.malewith medical history significant forhypertension, GERD, alcohol abuse, anxiety, depression who presents to the emergency department due to black stools noted at home today.  He was also noted to have an episode of hematemesis in the ED.  He underwent EGD on 4/9 with multiple gastric polyps, 1 of which was noted to be oozing blood and was removed.  He now claims that he will stop all alcohol use moving forward and will be placed on Librium taper on discharge.  He is to continue on Protonix twice daily as prescribed and follow-up with GI in 6-8 weeks for repeat EGD.  No other acute events noted during the course of this admission.  He is overall stable for discharge.  Discharge Diagnoses:  Principal Problem:   GI bleed Active Problems:   Hypertension   Alcohol abuse   GERD (gastroesophageal reflux disease)   Hypoalbuminemia due to protein-calorie malnutrition (HCC)   Transaminitis   Hyperglycemia   Hematemesis   Nausea & vomiting   Obesity (BMI 30.0-34.9)  Principal discharge diagnosis: Acute upper GI bleed secondary to bleeding gastric polyp in the setting of alcohol abuse.  Discharge Instructions  Discharge Instructions    Diet - low sodium heart healthy   Complete by: As directed    Increase activity slowly   Complete by: As directed       Allergies as of 05/28/2020   No Known Allergies     Medication List    TAKE these medications   acetaminophen 325 MG tablet Commonly known as: TYLENOL Take 2 tablets (650 mg total) by mouth every 6 (six) hours as needed for mild pain or moderate pain (Call Dr. Marlane Hatcher office if you need more pain medicine).   ALPRAZolam 1 MG tablet Commonly known as: XANAX Take 1 mg by mouth at bedtime as needed for sleep. 1 tablet at bedtime, and at 3am will take 1/2 tablet   chlordiazePOXIDE 10 MG capsule Commonly known as: LIBRIUM Take 2 capsules (20 mg total) by mouth 3 (three) times daily for 2 days, THEN 1 capsule (10 mg total) 3 (three) times daily for 2 days, THEN 1 capsule (10 mg total) 2 (two) times daily for 2 days, THEN 1 capsule (10 mg total) daily for 2 days. Start taking on: May 28, 2020   docusate sodium 100 MG capsule Commonly known as: COLACE Take 1 capsule (100 mg total) by mouth 2 (two) times daily.   HYDROcodone-acetaminophen 5-325 MG tablet Commonly known as: Norco Take 1-2 tablets by mouth every 6 (six) hours as needed for moderate pain.   losartan 100 MG tablet Commonly known as: COZAAR Take 100 mg by mouth daily.   ONE-A-DAY MENS HEALTH FORMULA PO Take 1 tablet by mouth daily.   pantoprazole 40 MG tablet Commonly known as: PROTONIX Take 1 tablet (40 mg total) by mouth 2 (two) times daily. What changed: when to take this  promethazine 12.5 MG tablet Commonly known as: PHENERGAN Take 1 tablet (12.5 mg total) by mouth every 4 (four) hours as needed for nausea or vomiting.   sildenafil 20 MG tablet Commonly known as: REVATIO Take 20 mg by mouth daily as needed. TAKE UP TO 5 TABLETS BY MOUTH ONE HOUR PRIOR TO INTERCOURSE FOR ED   thiamine 100 MG tablet Take 1 tablet (100 mg total) by mouth daily. Start taking on: May 29, 2020   VISINE OP Place 1 drop into both eyes daily as needed (dry eyes).       Follow-up Information    Gareth MorganKnowlton, Steve, MD.  Schedule an appointment as soon as possible for a visit in 1 week(s).   Specialty: Family Medicine Contact information: [address]  [place] [id number] [phone]        [facility]. Schedule an appointment as soon as possible for a visit in 6 week(s).   Contact information: [address] [place] [id number] [phone]             No Known Allergies  Consultations:  GI   Procedures/Studies:  No results found.   Discharge Exam: Vitals:   05/27/20 2008 05/28/20 0512  BP: 139/84 116/83  Pulse: 84 73  Resp: 18 14  Temp: 98.3 F (36.8 C) 98.1 F (36.7 C)  SpO2: 99% 97%   Vitals:   05/27/20 1200 05/27/20 1414 05/27/20 2008 05/28/20 0512  BP: 121/70 128/84 139/84 116/83  Pulse: 72 77 84 73  Resp: 16 20 18 14   Temp:  98 F (36.7 C) 98.3 F (36.8 C) 98.1 F (36.7 C)  TempSrc:   Oral Oral  SpO2: 97% 97% 99% 97%  Weight:      Height:        General: Pt is alert, awake, not in acute distress Cardiovascular: RRR, S1/S2 +, no rubs, no gallops Respiratory: CTA bilaterally, no wheezing, no rhonchi Abdominal: Soft, NT, ND, bowel sounds + Extremities: no edema, no cyanosis    The results of significant diagnostics from this hospitalization (including imaging, microbiology, ancillary and laboratory) are listed below for reference.     Microbiology: Recent Results (from the past 240 hour(s))  Resp Panel by RT-PCR (Flu A&B, Covid) Nasopharyngeal Swab     Status: None   Collection Time: 05/26/20  7:15 PM   Specimen: Nasopharyngeal Swab; Nasopharyngeal(NP) swabs in vial transport medium  Result Value Ref Range Status   SARS Coronavirus 2 by RT PCR NEGATIVE NEGATIVE Final    Comment: (NOTE) SARS-CoV-2 target nucleic acids are NOT DETECTED.  The SARS-CoV-2 RNA is generally detectable in upper respiratory specimens during the acute phase of infection. The lowest concentration of SARS-CoV-2 viral copies this  assay can detect is 138 copies/mL. A negative result does not preclude SARS-Cov-2 infection and should not be used as the sole basis for treatment or other patient management decisions. A negative result may occur with  improper specimen collection/handling, submission of specimen other than nasopharyngeal swab, presence of viral mutation(s) within the areas targeted by this assay, and inadequate number of viral copies(<138 copies/mL). A negative result must be combined with clinical observations, patient history, and epidemiological information. The expected result is Negative.  Fact Sheet for Patients:  BloggerCourse.comhttps://www.fda.gov/media/152166/download  Fact Sheet for Healthcare Providers:  SeriousBroker.ithttps://www.fda.gov/media/152162/download  This test is no t yet approved or cleared by the [place] FDA and  has been authorized for detection and/or diagnosis of SARS-CoV-2 by FDA under an Emergency Use Authorization (  EUA). This EUA will remain  in effect (meaning this test can be used) for the duration of the COVID-19 declaration under Section 564(b)(1) of the Act, 21 U.S.C.section 360bbb-3(b)(1), unless the authorization is terminated  or revoked sooner.       Influenza A by PCR NEGATIVE NEGATIVE Final   Influenza B by PCR NEGATIVE NEGATIVE Final    Comment: (NOTE) The Xpert Xpress SARS-CoV-2/FLU/RSV plus assay is intended as an aid in the diagnosis of influenza from Nasopharyngeal swab specimens and should not be used as a sole basis for treatment. Nasal washings and aspirates are unacceptable for Xpert Xpress SARS-CoV-2/FLU/RSV testing.  Fact Sheet for Patients: BloggerCourse.com  Fact Sheet for Healthcare Providers: SeriousBroker.it  This test is not yet approved or cleared by the [place] FDA and has been authorized for detection and/or diagnosis of SARS-CoV-2 by FDA under an Emergency Use Authorization (EUA). This EUA will  remain in effect (meaning this test can be used) for the duration of the COVID-19 declaration under Section 564(b)(1) of the Act, 21 U.S.C. section 360bbb-3(b)(1), unless the authorization is terminated or revoked.  Performed at [facility], [address] [place], [place] [address]   MRSA PCR Screening     Status: None   Collection Time: 05/26/20  9:30 PM   Specimen: Nasal Mucosa; Nasopharyngeal  Result Value Ref Range Status   MRSA by PCR NEGATIVE NEGATIVE Final    Comment:        The GeneXpert MRSA Assay (FDA approved for NASAL specimens only), is one component of a comprehensive MRSA colonization surveillance program. It is not intended to diagnose MRSA infection nor to guide or monitor treatment for MRSA infections. Performed at [facility], [address], [place], [place] [address]      Labs: BNP (last 3 results) No results for input(s): BNP in the last 8760 hours. Basic Metabolic Panel: Recent Labs  Lab 05/26/20 1742 05/27/20 0442 05/28/20 0359  NA 139 140 140  K 3.1* 3.6 3.4*  CL 104 106 106  CO2 24 26 26   GLUCOSE 146* 126* 105*  BUN 11 18 11   CREATININE 0.85 0.90 0.84  CALCIUM 8.3* 7.7* 7.7*  MG  --  1.4* 1.8  PHOS  --  2.6  --    Liver Function Tests: Recent Labs  Lab 05/26/20 1742 05/27/20 0442  AST 82* 46*  ALT 68* 47*  ALKPHOS 98 69  BILITOT 1.9* 1.3*  PROT 6.0* 5.1*  ALBUMIN 3.3* 2.9*   No results for input(s): LIPASE, AMYLASE in the last 168 hours. No results for input(s): AMMONIA in the last 168 hours. CBC: Recent Labs  Lab 05/26/20 1742 05/26/20 1923 05/27/20 0442 05/28/20 0359  WBC 10.6* 14.6* 10.4 7.6  NEUTROABS 7.5 10.7*  --   --   HGB 15.4 13.5 11.3* 10.4*  HCT 42.9 41.0 32.9* 30.9*  MCV 95.3 104.1* 98.5 100.7*  PLT 187 209 165 142*   Cardiac Enzymes: No results for input(s): CKTOTAL, CKMB, CKMBINDEX, TROPONINI in the last 168 hours. BNP: Invalid input(s): POCBNP CBG: Recent Labs  Lab 05/27/20 0805 05/27/20 1105   GLUCAP 129* 115*   D-Dimer No results for input(s): DDIMER in the last 72 hours. Hgb A1c No results for input(s): HGBA1C in the last 72 hours. Lipid Profile No results for input(s): CHOL, HDL, LDLCALC, TRIG, CHOLHDL, LDLDIRECT in the last 72 hours. Thyroid function studies No results for input(s): TSH, T4TOTAL, T3FREE, THYROIDAB in the last 72 hours.  Invalid input(s): FREET3 Anemia  work up No results for input(s): VITAMINB12, FOLATE, FERRITIN, TIBC, IRON, RETICCTPCT in the last 72 hours. Urinalysis    Component Value Date/Time   COLORURINE YELLOW 04/09/2019 1915   APPEARANCEUR HAZY (A) 04/09/2019 1915   LABSPEC 1.020 04/09/2019 1915   PHURINE 6.0 04/09/2019 1915   GLUCOSEU NEGATIVE 04/09/2019 1915   HGBUR NEGATIVE 04/09/2019 1915   BILIRUBINUR NEGATIVE 04/09/2019 1915   KETONESUR NEGATIVE 04/09/2019 1915   PROTEINUR 100 (A) 04/09/2019 1915   NITRITE NEGATIVE 04/09/2019 1915   LEUKOCYTESUR NEGATIVE 04/09/2019 1915   Sepsis Labs Invalid input(s): PROCALCITONIN,  WBC,  LACTICIDVEN Microbiology Recent Results (from the past 240 hour(s))  Resp Panel by RT-PCR (Flu A&B, Covid) Nasopharyngeal Swab     Status: None   Collection Time: 05/26/20  7:15 PM   Specimen: Nasopharyngeal Swab; Nasopharyngeal(NP) swabs in vial transport medium  Result Value Ref Range Status   SARS Coronavirus 2 by RT PCR NEGATIVE NEGATIVE Final    Comment: (NOTE) SARS-CoV-2 target nucleic acids are NOT DETECTED.  The SARS-CoV-2 RNA is generally detectable in upper respiratory specimens during the acute phase of infection. The lowest concentration of SARS-CoV-2 viral copies this assay can detect is 138 copies/mL. A negative result does not preclude SARS-Cov-2 infection and should not be used as the sole basis for treatment or other patient management decisions. A negative result may occur with  improper specimen collection/handling, submission of specimen other than nasopharyngeal swab, presence of  viral mutation(s) within the areas targeted by this assay, and inadequate number of viral copies(<138 copies/mL). A negative result must be combined with clinical observations, patient history, and epidemiological information. The expected result is Negative.  Fact Sheet for Patients:  BloggerCourse.com  Fact Sheet for Healthcare Providers:  SeriousBroker.it  This test is no t yet approved or cleared by the [place] FDA and  has been authorized for detection and/or diagnosis of SARS-CoV-2 by FDA under an Emergency Use Authorization (EUA). This EUA will remain  in effect (meaning this test can be used) for the duration of the COVID-19 declaration under Section 564(b)(1) of the Act, 21 U.S.C.section 360bbb-3(b)(1), unless the authorization is terminated  or revoked sooner.       Influenza A by PCR NEGATIVE NEGATIVE Final   Influenza B by PCR NEGATIVE NEGATIVE Final    Comment: (NOTE) The Xpert Xpress SARS-CoV-2/FLU/RSV plus assay is intended as an aid in the diagnosis of influenza from Nasopharyngeal swab specimens and should not be used as a sole basis for treatment. Nasal washings and aspirates are unacceptable for Xpert Xpress SARS-CoV-2/FLU/RSV testing.  Fact Sheet for Patients: BloggerCourse.com  Fact Sheet for Healthcare Providers: SeriousBroker.it  This test is not yet approved or cleared by the [place] FDA and has been authorized for detection and/or diagnosis of SARS-CoV-2 by FDA under an Emergency Use Authorization (EUA). This EUA will remain in effect (meaning this test can be used) for the duration of the COVID-19 declaration under Section 564(b)(1) of the Act, 21 U.S.C. section 360bbb-3(b)(1), unless the authorization is terminated or revoked.  Performed at [facility], [address] [place], [place] [address]   MRSA PCR Screening     Status: None    Collection Time: 05/26/20  9:30 PM   Specimen: Nasal Mucosa; Nasopharyngeal  Result Value Ref Range Status   MRSA by PCR NEGATIVE NEGATIVE Final    Comment:        The GeneXpert MRSA Assay (FDA approved for NASAL specimens only), is one component of a  comprehensive MRSA colonization surveillance program. It is not intended to diagnose MRSA infection nor to guide or monitor treatment for MRSA infections. Performed at [facility], [address] [place], [place] [address]      Time coordinating discharge: 35 minutes  SIGNED:   Erick Blinks, DO [facility] 05/28/2020, 9:22 AM  If 7PM-7AM, please contact night-coverage [email or web address]

## 2020-05-30 LAB — SURGICAL PATHOLOGY

## 2020-05-31 ENCOUNTER — Encounter (HOSPITAL_COMMUNITY): Payer: Self-pay | Admitting: Internal Medicine

## 2020-05-31 ENCOUNTER — Telehealth: Payer: Self-pay | Admitting: Internal Medicine

## 2020-05-31 MED ORDER — PANTOPRAZOLE SODIUM 40 MG PO TBEC
40.0000 mg | DELAYED_RELEASE_TABLET | Freq: Two times a day (BID) | ORAL | 5 refills | Status: DC
Start: 2020-05-31 — End: 2021-02-13

## 2020-05-31 NOTE — Telephone Encounter (Signed)
Protonix twice daily sent to pharmacy.  Patient instructed to take twice daily for the next 8 weeks and then decrease back down to once daily.

## 2020-07-16 ENCOUNTER — Encounter (HOSPITAL_COMMUNITY): Payer: Self-pay | Admitting: *Deleted

## 2020-07-16 ENCOUNTER — Other Ambulatory Visit: Payer: Self-pay

## 2020-07-16 ENCOUNTER — Emergency Department (HOSPITAL_COMMUNITY)
Admission: EM | Admit: 2020-07-16 | Discharge: 2020-07-16 | Disposition: A | Discharge status: Home / Self Care | Payer: 59 | Attending: Emergency Medicine | Admitting: Emergency Medicine

## 2020-07-16 DIAGNOSIS — I10 Essential (primary) hypertension: Secondary | ICD-10-CM | POA: Diagnosis not present

## 2020-07-16 DIAGNOSIS — R000 Tachycardia, unspecified: Secondary | ICD-10-CM | POA: Diagnosis not present

## 2020-07-16 DIAGNOSIS — K644 Residual hemorrhoidal skin tags: Secondary | ICD-10-CM | POA: Insufficient documentation

## 2020-07-16 DIAGNOSIS — F10239 Alcohol dependence with withdrawal, unspecified: Secondary | ICD-10-CM | POA: Insufficient documentation

## 2020-07-16 DIAGNOSIS — Z79899 Other long term (current) drug therapy: Secondary | ICD-10-CM | POA: Insufficient documentation

## 2020-07-16 DIAGNOSIS — K921 Melena: Secondary | ICD-10-CM | POA: Diagnosis present

## 2020-07-16 DIAGNOSIS — F419 Anxiety disorder, unspecified: Secondary | ICD-10-CM | POA: Insufficient documentation

## 2020-07-16 DIAGNOSIS — R195 Other fecal abnormalities: Secondary | ICD-10-CM

## 2020-07-16 DIAGNOSIS — Y900 Blood alcohol level of less than 20 mg/100 ml: Secondary | ICD-10-CM | POA: Insufficient documentation

## 2020-07-16 DIAGNOSIS — F10230 Alcohol dependence with withdrawal, uncomplicated: Secondary | ICD-10-CM

## 2020-07-16 DIAGNOSIS — F10930 Alcohol use, unspecified with withdrawal, uncomplicated: Secondary | ICD-10-CM

## 2020-07-16 LAB — COMPREHENSIVE METABOLIC PANEL
ALT: 43 U/L (ref 0–44)
AST: 61 U/L — ABNORMAL HIGH (ref 15–41)
Albumin: 4 g/dL (ref 3.5–5.0)
Alkaline Phosphatase: 84 U/L (ref 38–126)
Anion gap: 11 (ref 5–15)
BUN: 10 mg/dL (ref 8–23)
CO2: 24 mmol/L (ref 22–32)
Calcium: 8.1 mg/dL — ABNORMAL LOW (ref 8.9–10.3)
Chloride: 99 mmol/L (ref 98–111)
Creatinine, Ser: 0.87 mg/dL (ref 0.61–1.24)
GFR, Estimated: >60 mL/min (ref >60)
Glucose, Bld: 106 mg/dL — ABNORMAL HIGH (ref 70–99)
Potassium: 3.7 mmol/L (ref 3.5–5.1)
Sodium: 134 mmol/L — ABNORMAL LOW (ref 135–145)
Total Bilirubin: 1.3 mg/dL — ABNORMAL HIGH (ref 0.3–1.2)
Total Protein: 6.9 g/dL (ref 6.5–8.1)

## 2020-07-16 LAB — CBC WITH DIFFERENTIAL/PLATELET
Abs Immature Granulocytes: 0.03 10*3/uL (ref 0.00–0.07)
Basophils Absolute: 0.1 10*3/uL (ref 0.0–0.1)
Basophils Relative: 1 %
Eosinophils Absolute: 0 10*3/uL (ref 0.0–0.5)
Eosinophils Relative: 0 %
HCT: 36.4 % — ABNORMAL LOW (ref 39.0–52.0)
Hemoglobin: 11.9 g/dL — ABNORMAL LOW (ref 13.0–17.0)
Immature Granulocytes: 0 %
Lymphocytes Relative: 16 %
Lymphs Abs: 1.2 10*3/uL (ref 0.7–4.0)
MCH: 28.5 pg (ref 26.0–34.0)
MCHC: 32.7 g/dL (ref 30.0–36.0)
MCV: 87.3 fL (ref 80.0–100.0)
Monocytes Absolute: 1.4 10*3/uL — ABNORMAL HIGH (ref 0.1–1.0)
Monocytes Relative: 18 %
Neutro Abs: 5 10*3/uL (ref 1.7–7.7)
Neutrophils Relative %: 65 %
Platelets: 144 10*3/uL — ABNORMAL LOW (ref 150–400)
RBC: 4.17 MIL/uL — ABNORMAL LOW (ref 4.22–5.81)
RDW: 15.7 % — ABNORMAL HIGH (ref 11.5–15.5)
WBC: 7.7 10*3/uL (ref 4.0–10.5)
nRBC: 0 % (ref 0.0–0.2)

## 2020-07-16 LAB — TROPONIN I (HIGH SENSITIVITY): Troponin I (High Sensitivity): 5 ng/L (ref <18)

## 2020-07-16 LAB — ETHANOL: Alcohol, Ethyl (B): <10 mg/dL (ref <10)

## 2020-07-16 MED ORDER — LORAZEPAM 2 MG/ML IJ SOLN: 0.0000 mg | Freq: Two times a day (BID) | INTRAMUSCULAR | Status: DC

## 2020-07-16 MED ORDER — THIAMINE HCL 100 MG PO TABS: 100.0000 mg | ORAL_TABLET | Freq: Every day | ORAL | Status: DC

## 2020-07-16 MED ORDER — CHLORDIAZEPOXIDE HCL 25 MG PO CAPS: ORAL_CAPSULE | ORAL | 0 refills | Status: AC

## 2020-07-16 MED ORDER — THIAMINE HCL 100 MG/ML IJ SOLN
100.0000 mg | Freq: Every day | INTRAMUSCULAR | Status: DC
  Administered 2020-07-16: 100 mg via INTRAVENOUS
  Filled 2020-07-16: qty 2

## 2020-07-16 MED ORDER — LORAZEPAM 2 MG/ML IJ SOLN
0.0000 mg | Freq: Four times a day (QID) | INTRAMUSCULAR | Status: DC
  Administered 2020-07-16: 1 mg via INTRAVENOUS
  Filled 2020-07-16: qty 1

## 2020-07-16 MED ORDER — LORAZEPAM 1 MG PO TABS: 0.0000 mg | ORAL_TABLET | Freq: Two times a day (BID) | ORAL | Status: DC

## 2020-07-16 MED ORDER — CHLORDIAZEPOXIDE HCL 25 MG PO CAPS: ORAL_CAPSULE | ORAL | 0 refills | Status: DC

## 2020-07-16 MED ORDER — LORAZEPAM 1 MG PO TABS
0.0000 mg | ORAL_TABLET | Freq: Four times a day (QID) | ORAL | Status: DC
Start: 2020-07-16 — End: 2020-07-16

## 2020-07-16 NOTE — Discharge Instructions (Signed)
Lab work was reassuring.  I have given you a prescription for Librium please use as prescribed for alcohol withdrawal.  I note that you have XanAx at your disposal please do not take in conjunction with this medication as it can make you extremely sleepy.  I want you to follow-up with outpatient resources for alcohol dependency please review the documents are provided to you.  Your liver enzymes were slightly elevated today please follow-up with your PCP for reevaluation.  Come back to the emergency department if you develop chest pain, shortness of breath, severe abdominal pain, uncontrolled nausea, vomiting, diarrhea.

## 2020-07-16 NOTE — ED Triage Notes (Signed)
C/o blood in stool onset today, history of same denies pain

## 2020-07-16 NOTE — ED Provider Notes (Signed)
[facility] EMERGENCY DEPARTMENT Provider Note   CSN: [id number] Arrival date & time: 07/16/20  1424     History Chief Complaint  Patient presents with  . Blood In Stools    Marco PontiffBarry S Stevenson is a 65 y.o. male.  HPI   Patient with significant medical history of anxiety, alcohol dependency, upper GI bleed, gastric polyps presents with chief complaint of dark tarry stools.  Patient states he notices this morning, he had a 2 large bowel movements noted that his stool looked very dark, he also felt slightly anxious today felt a little bit more short of breath which he thinks it was from his anxiety.  He endorsed that last night he was having some diarrhea and took a big swig of Pepto-Bismol.  He denies any stomach pain, nausea, vomiting, constipation, he denies any urinary symptoms.  Patient has significant GI history of upper GI bleeds with gastric polyps, he was seen here approximate 2 months ago for similar presentation, endoscopy was performed and showed that he has an oozing polyp which was cut out.  He is on PPIs and was scheduled follow-up with GI in 6 weeks time for reevaluation.  Patient has no other complaints this time, he does note that he drinks half a liter of liquor daily, his last drink was last night and normally such drink around this time.  He thinks this might be why he is anxious.  He denies hallucinations or delusions, denies homicidal or suicidal ideations.  Patient denies headaches, fevers, chills, shortness of breath, chest pain, abdominal pain, nausea, vomiting, diarrhea worsening pedal edema.  Past Medical History:  Diagnosis Date  . Acid reflux   . Anxiety   . Depression   . Hypertension     Patient Active Problem List   Diagnosis Date Noted  . GI bleed 05/26/2020  . Hypoalbuminemia due to protein-calorie malnutrition (HCC) 05/26/2020  . Transaminitis 05/26/2020  . Hyperglycemia 05/26/2020  . Hematemesis 05/26/2020  . Nausea & vomiting 05/26/2020  . Obesity  (BMI 30.0-34.9) 05/26/2020  . Renal mass 09/24/2019  . Enteritis 04/10/2019  . Syncopal episodes 05/12/2018  . UGI bleed 05/11/2018  . Hypertension 05/11/2018  . Alcohol abuse 05/11/2018  . GERD (gastroesophageal reflux disease) 05/11/2018  . Hypomagnesemia 05/11/2018  . Acute blood loss anemia 05/11/2018    Past Surgical History:  Procedure Laterality Date  . BIOPSY  05/14/2018   Procedure: BIOPSY;  Surgeon: Vida RiggerMagod, Marc, MD;  [facility];  Service: Endoscopy;;  . COLONOSCOPY    . ESOPHAGOGASTRODUODENOSCOPY (EGD) WITH PROPOFOL N/A 05/12/2018   Procedure: ESOPHAGOGASTRODUODENOSCOPY (EGD) WITH PROPOFOL;  Surgeon: West BaliFields, Sandi L, MD;  Location: [facility];  Service: Endoscopy;  Laterality: N/A;  . ESOPHAGOGASTRODUODENOSCOPY (EGD) WITH PROPOFOL N/A 05/14/2018   Procedure: ESOPHAGOGASTRODUODENOSCOPY (EGD) WITH PROPOFOL;  Surgeon: Vida RiggerMagod, Marc, MD;  [facility];  Service: Endoscopy;  Laterality: N/A;  start with side-viewing scope  . ESOPHAGOGASTRODUODENOSCOPY (EGD) WITH PROPOFOL N/A 09/23/2018   Procedure: ESOPHAGOGASTRODUODENOSCOPY (EGD) WITH PROPOFOL;  Surgeon: Willis Modenautlaw, Chelsia Serres, MD;  Location: [facility] ENDOSCOPY;  Service: Endoscopy;  Laterality: N/A;  endo loop  . ESOPHAGOGASTRODUODENOSCOPY (EGD) WITH PROPOFOL N/A 05/27/2020   Procedure: ESOPHAGOGASTRODUODENOSCOPY (EGD) WITH PROPOFOL;  Surgeon: Lanelle Balarver, Charles K, DO;  Location: [facility];  Service: Endoscopy;  Laterality: N/A;  . EUS N/A 09/23/2018   Procedure: UPPER ENDOSCOPIC ULTRASOUND (EUS) RADIAL;  Surgeon: Willis Modenautlaw, Pawan Knechtel, MD;  Location: [facility] ENDOSCOPY;  Service: Endoscopy;  Laterality: N/A;  . HERNIA REPAIR    . POLYPECTOMY  05/12/2018   Procedure: POLYPECTOMY;  Surgeon: West Bali, MD;  Location: [facility];  Service: Endoscopy;;  polyp gastric  . POLYPECTOMY  09/23/2018   Procedure: POLYPECTOMY;  Surgeon: Willis Modena, MD;  Location: [facility] ENDOSCOPY;  Service: Endoscopy;;  . POLYPECTOMY  05/27/2020   Procedure:  POLYPECTOMY;  Surgeon: Lanelle Bal, DO;  Location: [facility];  Service: Endoscopy;;  gastric  . ROBOTIC ASSITED PARTIAL NEPHRECTOMY Right 09/24/2019   Procedure: XI ROBOTIC ASSITED PARTIAL NEPHRECTOMY;  Surgeon: Rene Paci, MD;  Location: [facility] ORS;  Service: Urology;  Laterality: Right;  . SUBMUCOSAL INJECTION  09/23/2018   Procedure: SUBMUCOSAL INJECTION;  Surgeon: Willis Modena, MD;  Location: [facility] ENDOSCOPY;  Service: Endoscopy;;       Family History  Problem Relation Age of Onset  . Hypertension Father   . Colon cancer Neg Hx   . Colon polyps Neg Hx     Social History   Tobacco Use  . Smoking status: Never Smoker  . Smokeless tobacco: Never Used  Vaping Use  . Vaping Use: Never used  Substance Use Topics  . Alcohol use: Yes    Comment: drank last night.  . Drug use: No    Home Medications Prior to Admission medications   Medication Sig Start Date End Date Taking? Authorizing Provider  acetaminophen (TYLENOL) 325 MG tablet Take 2 tablets (650 mg total) by mouth every 6 (six) hours as needed for mild pain or moderate pain (Call Dr. Marlane Hatcher office if you need more pain medicine). 04/12/19   Sherrie George, PA-C  ALPRAZolam Prudy Feeler) 1 MG tablet Take 1 mg by mouth at bedtime as needed for sleep. 1 tablet at bedtime, and at 3am will take 1/2 tablet 03/13/18   [provider]  chlordiazePOXIDE (LIBRIUM) 25 MG capsule 50mg  PO TID x 1D, then 25-50mg  PO BID X 1D, then 25-50mg  PO QD X 1D 07/16/20   07/18/20, PA-C  docusate sodium (COLACE) 100 MG capsule Take 1 capsule (100 mg total) by mouth 2 (two) times daily. 09/24/19   11/24/19, PA-C  HYDROcodone-acetaminophen (NORCO) 5-325 MG tablet Take 1-2 tablets by mouth every 6 (six) hours as needed for moderate pain. 09/24/19   11/24/19, PA-C  losartan (COZAAR) 100 MG tablet Take 100 mg by mouth daily.  04/08/18   [provider]  Multiple Vitamins-Minerals (ONE-A-DAY [employer] FORMULA  PO) Take 1 tablet by mouth daily.    [provider]  pantoprazole (PROTONIX) 40 MG tablet Take 1 tablet (40 mg total) by mouth 2 (two) times daily. 05/31/20 11/27/20  01/27/21, DO  promethazine (PHENERGAN) 12.5 MG tablet Take 1 tablet (12.5 mg total) by mouth every 4 (four) hours as needed for nausea or vomiting. 09/24/19   11/24/19, PA-C  sildenafil (REVATIO) 20 MG tablet Take 20 mg by mouth daily as needed. TAKE UP TO 5 TABLETS BY MOUTH ONE HOUR PRIOR TO INTERCOURSE FOR ED    [provider]  Tetrahydrozoline HCl (VISINE OP) Place 1 drop into both eyes daily as needed (dry eyes).    [provider]    Allergies    Patient has no known allergies.  Review of Systems   Review of Systems  Constitutional: Negative for chills and fever.  HENT: Negative for congestion.   Respiratory: Negative for shortness of breath.   Cardiovascular: Negative for chest pain.  Gastrointestinal: Positive for blood in stool and diarrhea. Negative for abdominal pain, constipation, nausea and  vomiting.  Genitourinary: Negative for enuresis.  Musculoskeletal: Negative for back pain.  Skin: Negative for rash.  Neurological: Negative for dizziness.  Hematological: Does not bruise/bleed easily.    Physical Exam Updated Vital Signs BP 118/82   Pulse (!) 101   Temp 98.4 F (36.9 C) (Oral)   Resp 18   Ht 5\' 10"  (1.778 m)   Wt 95.3 kg   SpO2 98%   BMI 30.13 kg/m   Physical Exam Vitals and nursing note reviewed. Exam conducted with a chaperone present.  Constitutional:      General: He is not in acute distress.    Appearance: He is not ill-appearing.  HENT:     Head: Normocephalic and atraumatic.     Nose: No congestion.  Eyes:     Conjunctiva/sclera: Conjunctivae normal.  Cardiovascular:     Rate and Rhythm: Normal rate and regular rhythm.     Pulses: Normal pulses.     Heart sounds: No murmur heard. No friction rub. No gallop.   Pulmonary:     Effort: No  respiratory distress.     Breath sounds: No wheezing, rhonchi or rales.  Abdominal:     Palpations: Abdomen is soft.     Tenderness: There is no abdominal tenderness. There is no right CVA tenderness or left CVA tenderness.  Genitourinary:    Rectum: Normal. Guaiac result negative.     Comments: With chaperone present rectal exam was performed he has noted external hemorrhoids, no abnormalities internally, no bright red blood in her melena present Hemoccult negative Musculoskeletal:     Cervical back: No rigidity.     Right lower leg: No edema.     Left lower leg: No edema.  Skin:    General: Skin is warm and dry.  Neurological:     Mental Status: He is alert.  Psychiatric:        Mood and Affect: Mood normal.     ED Results / Procedures / Treatments   Labs (all labs ordered are listed, but only abnormal results are displayed) Labs Reviewed  COMPREHENSIVE METABOLIC PANEL - Abnormal; Notable for the following components:      Result Value   Sodium 134 (*)    Glucose, Bld 106 (*)    Calcium 8.1 (*)    AST 61 (*)    Total Bilirubin 1.3 (*)    All other components within normal limits  CBC WITH DIFFERENTIAL/PLATELET - Abnormal; Notable for the following components:   RBC 4.17 (*)    Hemoglobin 11.9 (*)    HCT 36.4 (*)    RDW 15.7 (*)    Platelets 144 (*)    Monocytes Absolute 1.4 (*)    All other components within normal limits  ETHANOL  POC OCCULT BLOOD, ED  TROPONIN I (HIGH SENSITIVITY)    EKG EKG Interpretation  Date/Time:  Sunday Jul 16 2020 15:32:01 EDT Ventricular Rate:  101 PR Interval:  147 QRS Duration: 87 QT Interval:  338 QTC Calculation: 439 R Axis:   24 Text Interpretation: Sinus tachycardia Borderline T abnormalities, diffuse leads Confirmed by [id number] (Eber Hong) on 07/16/2020 4:16:13 PM   Radiology No results found.  Procedures Procedures   Medications Ordered in ED Medications  LORazepam (ATIVAN) injection 0-4 mg (1 mg Intravenous Given  07/16/20 1524)    Or  LORazepam (ATIVAN) tablet 0-4 mg ( Oral See Alternative 07/16/20 1524)  LORazepam (ATIVAN) injection 0-4 mg (has no administration in time range)    Or  LORazepam (ATIVAN) tablet 0-4 mg (has no administration in time range)  thiamine tablet 100 mg ( Oral See Alternative 07/16/20 1524)    Or  thiamine (B-1) injection 100 mg (100 mg Intravenous Given 07/16/20 1524)    ED Course  I have reviewed the triage vital signs and the nursing notes.  Pertinent labs & imaging results that were available during my care of the patient were reviewed by me and considered in my medical decision making (see chart for details).    MDM Rules/Calculators/A&P                         Initial impression-patient presents with concern of dark tarry stools.  He is alert, does not appear to be in acute stress, vital signs show tachycardia.  I suspect patient may be suffering from slight alcohol withdrawals, will obtain basic lab work-up and reassess.  Work-up-CBC shows slight normocytic anemia with hemoglobin 11.9 appears to be baseline for patient, CMP shows slight hyponatremia 134, slight hyperglycemia 106, slightly elevated liver enzymes AST 61, T bili slightly elevated 1.3.  Initial troponin is 5, ethanol less than 10.  Hemoccult negative.  EKG sinus tach without signs of ischemia  Reassessment -patient reassessed after Ativan, he states he feels much better, vital signs have improved, peripheral tremors have also ceased.  Patient agreed for discharge at this time.  Rule out- low suspicion for ACS as history is atypical, patient has low risk factors, EKG without signs of ischemia first troponin is 5.  Will defer second troponin as patient has been chest pain free for greater than 12 hours, would expect elevation in troponin if ACS was present.  Low suspicion for PE as patient denies pleuritic chest pain, there is no pedal edema present my exam, patient was noted to be tachycardic but I suspect this  secondary due to alcohol draws this is resolved after patient received Ativan.  Low suspicion for GI bleed as Hemoccult was negative, hemoglobin is at patient's baseline, he denies hematemesis at this time.  I suspect dark tarry stools were secondary due to over-the-counter medication.  Low suspicion for ruptured stomach ulcer as abdomen was nondistended, dull to percussion, no peritoneal sign present my exam.  Low suspicion for alcohol withdrawals requiring hospital admission as patient denies homicidal or suicidal ideations, denies hallucination delusions, tremors ceased with Ativan, vital signs have improved.  Patient does have slightly elevated liver enzymes and T bili I suspect this secondary due to alcohol use, I doubt cirrhosis at this time as there is no jaundice present my exam, no abnormal bleeding.   Plan-  1.  Dark tarry stools since resolved-recommend he continue with his home medications, follow-up with GI for further evaluation.  2.  Alcohol dependency-we will provide patient with Librium taper, provide with outpatient resources follow with PCP as needed.  Vital signs have remained stable, no indication for hospital admission. Patient given at home care as well strict return precautions.  Patient verbalized that they understood agreed to said plan.   Final Clinical Impression(s) / ED Diagnoses Final diagnoses:  Dark stools  Alcohol withdrawal syndrome without complication (HCC)    Rx / DC Orders ED Discharge Orders         Ordered    chlordiazePOXIDE (LIBRIUM) 25 MG capsule  Status:  Discontinued        07/16/20 1630    chlordiazePOXIDE (LIBRIUM) 25 MG capsule        07/16/20 1631  Carroll Sage, PA-C 07/16/20 1634    Maia Plan, MD 07/17/20 [phone]

## 2020-10-30 ENCOUNTER — Encounter: Payer: Self-pay | Admitting: Internal Medicine

## 2021-01-12 ENCOUNTER — Emergency Department (HOSPITAL_COMMUNITY)
Admission: EM | Admit: 2021-01-12 | Discharge: 2021-01-12 | Disposition: A | Discharge status: Home / Self Care | Payer: 59 | Attending: Emergency Medicine | Admitting: Emergency Medicine

## 2021-01-12 ENCOUNTER — Encounter (HOSPITAL_COMMUNITY): Payer: Self-pay

## 2021-01-12 ENCOUNTER — Other Ambulatory Visit: Payer: Self-pay

## 2021-01-12 ENCOUNTER — Telehealth (INDEPENDENT_AMBULATORY_CARE_PROVIDER_SITE_OTHER): Payer: Self-pay | Admitting: Gastroenterology

## 2021-01-12 DIAGNOSIS — Z79899 Other long term (current) drug therapy: Secondary | ICD-10-CM | POA: Insufficient documentation

## 2021-01-12 DIAGNOSIS — I10 Essential (primary) hypertension: Secondary | ICD-10-CM | POA: Insufficient documentation

## 2021-01-12 DIAGNOSIS — K625 Hemorrhage of anus and rectum: Secondary | ICD-10-CM | POA: Diagnosis present

## 2021-01-12 LAB — TYPE AND SCREEN
ABO/RH(D): B POS
Antibody Screen: NEGATIVE

## 2021-01-12 LAB — CBC
HCT: 43.5 % (ref 39.0–52.0)
Hemoglobin: 14.4 g/dL (ref 13.0–17.0)
MCH: 26.9 pg (ref 26.0–34.0)
MCHC: 33.1 g/dL (ref 30.0–36.0)
MCV: 81.3 fL (ref 80.0–100.0)
Platelets: 262 10*3/uL (ref 150–400)
RBC: 5.35 MIL/uL (ref 4.22–5.81)
RDW: 17.1 % — ABNORMAL HIGH (ref 11.5–15.5)
WBC: 10.4 10*3/uL (ref 4.0–10.5)
nRBC: 0 % (ref 0.0–0.2)

## 2021-01-12 LAB — COMPREHENSIVE METABOLIC PANEL
ALT: 59 U/L — ABNORMAL HIGH (ref 0–44)
AST: 73 U/L — ABNORMAL HIGH (ref 15–41)
Albumin: 3.9 g/dL (ref 3.5–5.0)
Alkaline Phosphatase: 79 U/L (ref 38–126)
Anion gap: 17 — ABNORMAL HIGH (ref 5–15)
BUN: 9 mg/dL (ref 8–23)
CO2: 19 mmol/L — ABNORMAL LOW (ref 22–32)
Calcium: 8.1 mg/dL — ABNORMAL LOW (ref 8.9–10.3)
Chloride: 102 mmol/L (ref 98–111)
Creatinine, Ser: 0.88 mg/dL (ref 0.61–1.24)
GFR, Estimated: >60 mL/min (ref >60)
Glucose, Bld: 101 mg/dL — ABNORMAL HIGH (ref 70–99)
Potassium: 3.5 mmol/L (ref 3.5–5.1)
Sodium: 138 mmol/L (ref 135–145)
Total Bilirubin: 1 mg/dL (ref 0.3–1.2)
Total Protein: 7.1 g/dL (ref 6.5–8.1)

## 2021-01-12 LAB — POC OCCULT BLOOD, ED: Fecal Occult Blood, POC: POSITIVE — AB

## 2021-01-12 NOTE — Discharge Instructions (Addendum)
It appears that you have had a small amount of bleeding probably from the upper GI tract and/or stomach.  Try to avoid alcohol.  Make sure you are drinking plenty of water.  Follow-up with either your PCP or GI doctor if you continue to see dark stool or are concerned about blood in your stool.  Also, call Dr. Marletta Lor for a follow-up appointment.  He had planned on doing another endoscopy to evaluate you.  This should be done within the next month.  Return here if needed.

## 2021-01-12 NOTE — ED Triage Notes (Signed)
Pt presents to ED with complaints of black stools. Pt states started this am. Yesterday stool was fine.

## 2021-01-12 NOTE — Telephone Encounter (Signed)
Patient called today stating that he noticed new onset of melena since yesterday.  He went to the ER today and his hemoglobin was more than 14.  FOBT was positive but based on the assessment by Dr. Effie Shy is a student Amanda [employer].  He was discharged home.  However, the patient called as he was concerned as his stool was still black.  He was feeling some diaphoresis but he blaming on the fact that he turned the heating  at home.  He denies having any lightheadedness dizziness or syncope.  No rectal bleeding.  I advised him to report his symptoms and if they are present he should come back to the ER.  He wanted me to relay the message to Dr. Marletta Lor.Marland Kitchen  He said that if he noticed persistent melena he may come back to the ER to have his hemoglobin rechecked.

## 2021-01-12 NOTE — ED Notes (Signed)
Patient denies any dizziness upon sitting and standing.  

## 2021-01-12 NOTE — ED Provider Notes (Signed)
[facility] EMERGENCY DEPARTMENT Provider Note   CSN: [id number] Arrival date & time: 01/12/21  8938     History Chief Complaint  Patient presents with   Rectal Bleeding    Marco Stevenson is a 65 y.o. male.  HPI Patient presents for evaluation of dark stools.  He has prior history of alcoholism.  He had upper endoscopy in April 2022 at which time he had a polypectomy treated with excision and clipping.  The clip that was used is MRI conditional.  He was due to have follow-up colonoscopy in June 2022 but did not have it.  He states that he had a big meal yesterday and did drink some cranberry vodka also wonders if he irritated his stomach.  He has not been using Pepto-Bismol.  He denies chest pain, shortness of breath, focal weakness or paresthesia.  There are no other known modifying factors.    Past Medical History:  Diagnosis Date   Acid reflux    Anxiety    Depression    Hypertension     Patient Active Problem List   Diagnosis Date Noted   GI bleed 05/26/2020   Hypoalbuminemia due to protein-calorie malnutrition (HCC) 05/26/2020   Transaminitis 05/26/2020   Hyperglycemia 05/26/2020   Hematemesis 05/26/2020   Nausea & vomiting 05/26/2020   Obesity (BMI 30.0-34.9) 05/26/2020   Renal mass 09/24/2019   Enteritis 04/10/2019   Syncopal episodes 05/12/2018   UGI bleed 05/11/2018   Hypertension 05/11/2018   Alcohol abuse 05/11/2018   GERD (gastroesophageal reflux disease) 05/11/2018   Hypomagnesemia 05/11/2018   Acute blood loss anemia 05/11/2018    Past Surgical History:  Procedure Laterality Date   BIOPSY  05/14/2018   Procedure: BIOPSY;  Surgeon: Vida Rigger, MD;  Location: [facility] ENDOSCOPY;  Service: Endoscopy;;   COLONOSCOPY     ESOPHAGOGASTRODUODENOSCOPY (EGD) WITH PROPOFOL N/A 05/12/2018   Procedure: ESOPHAGOGASTRODUODENOSCOPY (EGD) WITH PROPOFOL;  Surgeon: West Bali, MD;  Location: [facility] ENDO SUITE;  Service: Endoscopy;  Laterality: N/A;    ESOPHAGOGASTRODUODENOSCOPY (EGD) WITH PROPOFOL N/A 05/14/2018   Procedure: ESOPHAGOGASTRODUODENOSCOPY (EGD) WITH PROPOFOL;  Surgeon: Vida Rigger, MD;  Location: [facility] ENDOSCOPY;  Service: Endoscopy;  Laterality: N/A;  start with side-viewing scope   ESOPHAGOGASTRODUODENOSCOPY (EGD) WITH PROPOFOL N/A 09/23/2018   Procedure: ESOPHAGOGASTRODUODENOSCOPY (EGD) WITH PROPOFOL;  Surgeon: Willis Modena, MD;  Location: [facility] ENDOSCOPY;  Service: Endoscopy;  Laterality: N/A;  endo loop   ESOPHAGOGASTRODUODENOSCOPY (EGD) WITH PROPOFOL N/A 05/27/2020   Procedure: ESOPHAGOGASTRODUODENOSCOPY (EGD) WITH PROPOFOL;  Surgeon: Lanelle Bal, DO;  Location: [facility] ENDO [facility]ITE;  Service: Endoscopy;  Laterality: N/A;   EUS N/A 09/23/2018   Procedure: UPPER ENDOSCOPIC ULTRASOUND (EUS) RADIAL;  Surgeon: Willis Modena, MD;  Location: [facility] ENDOSCOPY;  Service: Endoscopy;  Laterality: N/A;   HERNIA REPAIR     POLYPECTOMY  05/12/2018   Procedure: POLYPECTOMY;  Surgeon: West Bali, MD;  Location: [facility] ENDO [facility]ITE;  Service: Endoscopy;;  polyp gastric   POLYPECTOMY  09/23/2018   Procedure: POLYPECTOMY;  Surgeon: Willis Modena, MD;  Location: [facility] ENDOSCOPY;  Service: Endoscopy;;   POLYPECTOMY  05/27/2020   Procedure: POLYPECTOMY;  Surgeon: Lanelle Bal, DO;  Location: [facility] ENDO [facility]ITE;  Service: Endoscopy;;  gastric   ROBOTIC ASSITED PARTIAL NEPHRECTOMY Right 09/24/2019   Procedure: XI ROBOTIC ASSITED PARTIAL NEPHRECTOMY;  Surgeon: Rene Paci, MD;  Location: [facility] ORS;  Service: Urology;  Laterality: Right;   SUBMUCOSAL INJECTION  09/23/2018   Procedure: SUBMUCOSAL INJECTION;  Surgeon: Willis Modena, MD;  Location: [facility] ENDOSCOPY;  Service: Endoscopy;;       Family History  Problem Relation Age of Onset   Hypertension Father    Colon cancer Neg Hx    Colon polyps Neg Hx     Social History   Tobacco Use   Smoking status: Never   Smokeless tobacco: Never  Vaping Use   Vaping Use: Never used  Substance Use Topics    Alcohol use: Yes    Comment: drank last night   Drug use: No    Home Medications Prior to Admission medications   Medication Sig Start Date End Date Taking? Authorizing Provider  acetaminophen (TYLENOL) 325 MG tablet Take 2 tablets (650 mg total) by mouth every 6 (six) hours as needed for mild pain or moderate pain (Call Dr. Marlane Hatcher office if you need more pain medicine). 04/12/19   Sherrie George, PA-C  ALPRAZolam Prudy Feeler) 1 MG tablet Take 1 mg by mouth at bedtime as needed for sleep. 1 tablet at bedtime, and at 3am will take 1/2 tablet 03/13/18   [provider]  chlordiazePOXIDE (LIBRIUM) 25 MG capsule 50mg  PO TID x 1D, then 25-50mg  PO BID X 1D, then 25-50mg  PO QD X 1D 07/16/20   07/18/20, PA-C  docusate sodium (COLACE) 100 MG capsule Take 1 capsule (100 mg total) by mouth 2 (two) times daily. 09/24/19   11/24/19, PA-C  HYDROcodone-acetaminophen (NORCO) 5-325 MG tablet Take 1-2 tablets by mouth every 6 (six) hours as needed for moderate pain. 09/24/19   11/24/19, PA-C  losartan (COZAAR) 100 MG tablet Take 100 mg by mouth daily.  04/08/18   [provider]  Multiple Vitamins-Minerals (ONE-A-DAY MENS HEALTH FORMULA PO) Take 1 tablet by mouth daily.    [provider]  pantoprazole (PROTONIX) 40 MG tablet Take 1 tablet (40 mg total) by mouth 2 (two) times daily. 05/31/20 11/27/20  01/27/21, DO  promethazine (PHENERGAN) 12.5 MG tablet Take 1 tablet (12.5 mg total) by mouth every 4 (four) hours as needed for nausea or vomiting. 09/24/19   11/24/19, PA-C  sildenafil (REVATIO) 20 MG tablet Take 20 mg by mouth daily as needed. TAKE UP TO 5 TABLETS BY MOUTH ONE HOUR PRIOR TO INTERCOURSE FOR ED    [provider]  Tetrahydrozoline HCl (VISINE OP) Place 1 drop into both eyes daily as needed (dry eyes).    [provider]    Allergies    Patient has no known allergies.  Review of Systems   Review of Systems  Physical  Exam Updated Vital Signs BP (!) 131/101   Pulse (!) 101   Temp 97.6 F (36.4 C) (Oral)   Resp 18   Ht 5\' 10"  (1.778 m)   Wt 95.3 kg   SpO2 96%   BMI 30.13 kg/m   Physical Exam Vitals and nursing note reviewed.  Constitutional:      Appearance: He is well-developed. He is not ill-appearing.  HENT:     Head: Normocephalic and atraumatic.     Right Ear: External ear normal.     Left Ear: External ear normal.  Eyes:     Conjunctiva/sclera: Conjunctivae normal.     Pupils: Pupils are equal, round, and reactive to light.  Neck:     Trachea: Phonation normal.  Cardiovascular:     Rate and Rhythm: Normal rate and regular rhythm.     Heart sounds: Normal heart sounds.  Pulmonary:     Effort: Pulmonary effort  is normal.     Breath sounds: Normal breath sounds.  Abdominal:     Palpations: Abdomen is soft.     Tenderness: There is no abdominal tenderness.  Genitourinary:    Comments: Normal anus.  Small amount of brown stool in rectal vault.  No rectal mass, or hemorrhoids. Musculoskeletal:        General: Normal range of motion.     Cervical back: Normal range of motion and neck supple.  Skin:    General: Skin is warm and dry.  Neurological:     Mental Status: He is alert and oriented to person, place, and time.     Cranial Nerves: No cranial nerve deficit.     Sensory: No sensory deficit.     Motor: No abnormal muscle tone.     Coordination: Coordination normal.  Psychiatric:        Behavior: Behavior normal.        Thought Content: Thought content normal.        Judgment: Judgment normal.    ED Results / Procedures / Treatments   Labs (all labs ordered are listed, but only abnormal results are displayed) Labs Reviewed  COMPREHENSIVE METABOLIC PANEL - Abnormal; Notable for the following components:      Result Value   CO2 19 (*)    Glucose, Bld 101 (*)    Calcium 8.1 (*)    AST 73 (*)    ALT 59 (*)    Anion gap 17 (*)    All other components within normal  limits  CBC - Abnormal; Notable for the following components:   RDW 17.1 (*)    All other components within normal limits  POC OCCULT BLOOD, ED - Abnormal; Notable for the following components:   Fecal Occult Blood, POC Positive (*)    All other components within normal limits  TYPE AND SCREEN    EKG None  Radiology No results found.  Procedures Procedures   Medications Ordered in ED Medications - No data to display  ED Course  I have reviewed the triage vital signs and the nursing notes.  Pertinent labs & imaging results that were available during my care of the patient were reviewed by me and considered in my medical decision making (see chart for details).    MDM Rules/Calculators/A&P                            Patient Vitals for the past 24 hrs:  BP Temp Temp src Pulse Resp SpO2 Height Weight  01/12/21 1025 (!) 131/101 -- -- (!) 101 18 96 % -- --  01/12/21 1005 -- -- -- -- -- -- 5\' 10"  (1.778 m) 95.3 kg  01/12/21 1004 (!) 143/104 97.6 F (36.4 C) Oral (!) 112 18 98 % -- --    11:24 AM Reevaluation with update and discussion. After initial assessment and treatment, an updated evaluation reveals No change in clinical status, findings discussed with the patient all questions were answered. 01/14/21   Medical Decision Making:  This patient is presenting for evaluation of suspected rectal bleeding, which does require a range of treatment options, and is a complaint that involves a moderate risk of morbidity and mortality. The differential diagnoses include upper GI bleeding, nonspecific dark stool, complications from prior polypectomy versus ongoing alcohol abuse. I decided to review old records, and in summary patient with history of alcohol abuse, and upper GI polypectomy..  I did  not require additional historical information from anyone.  Clinical Laboratory Tests Ordered, included CBC, Metabolic panel, and stool for occult blood . Review indicates normal  except CO2 low, glucose high, AST high, ALT high, anion gap low.   Critical Interventions-clinical evaluation, laboratory testing, observation and reassessment  After These Interventions, the Patient was reevaluated and was found stable for discharge.  No evidence for active GI bleeding.  Small amount of blood in stool which is brown in color.  Patient did not follow-up for repeat endoscopy in July or August of this year.  He is stable for discharge now with outpatient follow-up.  Have encouraged him to follow-up with either PCP or GI for ongoing black stools and with GI as soon as possible for repeat endoscopy.  CRITICAL CARE-no Performed by: Mancel Bale  Nursing Notes Reviewed/ Care Coordinated Applicable Imaging Reviewed Interpretation of Laboratory Data incorporated into ED treatment  The patient appears reasonably screened and/or stabilized for discharge and I doubt any other medical condition or other [facility] requiring further screening, evaluation, or treatment in the ED at this time prior to discharge.  Plan: Home Medications-continue current; Home Treatments-avoid alcohol; return here if the recommended treatment, does not improve the symptoms; Recommended follow up-GI within 1 month for endoscopy, PCP and/or GI for problems in the interim.     Final Clinical Impression(s) / ED Diagnoses Final diagnoses:  Rectal bleeding    Rx / DC Orders ED Discharge Orders     None        Mancel Bale, MD 01/12/21 1126

## 2021-01-15 ENCOUNTER — Telehealth: Payer: Self-pay | Admitting: Internal Medicine

## 2021-01-15 NOTE — Telephone Encounter (Signed)
Thanks Reuel Boom for the letting me know.  We will get him into clinic to see me urgently.

## 2021-01-15 NOTE — Telephone Encounter (Signed)
New patient referral

## 2021-01-15 NOTE — Telephone Encounter (Signed)
Not a problem. Sorry about the typo, meant to say the stool was light brown.

## 2021-01-31 ENCOUNTER — Encounter: Payer: Self-pay | Admitting: Internal Medicine

## 2021-01-31 ENCOUNTER — Ambulatory Visit: Payer: 59 | Admitting: Internal Medicine

## 2021-02-13 ENCOUNTER — Other Ambulatory Visit: Payer: Self-pay | Admitting: Internal Medicine

## 2021-11-14 DIAGNOSIS — I10 Essential (primary) hypertension: Secondary | ICD-10-CM | POA: Diagnosis not present

## 2021-11-20 DIAGNOSIS — K219 Gastro-esophageal reflux disease without esophagitis: Secondary | ICD-10-CM | POA: Diagnosis not present

## 2021-11-20 DIAGNOSIS — K5792 Diverticulitis of intestine, part unspecified, without perforation or abscess without bleeding: Secondary | ICD-10-CM | POA: Diagnosis not present

## 2021-11-20 DIAGNOSIS — R21 Rash and other nonspecific skin eruption: Secondary | ICD-10-CM | POA: Diagnosis not present

## 2021-11-20 DIAGNOSIS — I10 Essential (primary) hypertension: Secondary | ICD-10-CM | POA: Diagnosis not present

## 2021-11-20 DIAGNOSIS — Z9889 Other specified postprocedural states: Secondary | ICD-10-CM | POA: Diagnosis not present

## 2021-11-20 DIAGNOSIS — Z733 Stress, not elsewhere classified: Secondary | ICD-10-CM | POA: Diagnosis not present

## 2021-11-20 DIAGNOSIS — Z905 Acquired absence of kidney: Secondary | ICD-10-CM | POA: Diagnosis not present

## 2021-11-20 DIAGNOSIS — R69 Illness, unspecified: Secondary | ICD-10-CM | POA: Diagnosis not present

## 2021-11-20 DIAGNOSIS — L989 Disorder of the skin and subcutaneous tissue, unspecified: Secondary | ICD-10-CM | POA: Diagnosis not present

## 2021-11-20 DIAGNOSIS — E669 Obesity, unspecified: Secondary | ICD-10-CM | POA: Diagnosis not present

## 2021-11-20 DIAGNOSIS — Z85528 Personal history of other malignant neoplasm of kidney: Secondary | ICD-10-CM | POA: Diagnosis not present

## 2021-12-04 ENCOUNTER — Encounter: Payer: Self-pay | Admitting: Emergency Medicine

## 2021-12-04 ENCOUNTER — Ambulatory Visit
Admission: EM | Admit: 2021-12-04 | Discharge: 2021-12-04 | Disposition: A | Discharge status: Home / Self Care | Payer: 59

## 2021-12-04 ENCOUNTER — Emergency Department (HOSPITAL_COMMUNITY)
Admission: EM | Admit: 2021-12-04 | Discharge: 2021-12-04 | Discharge status: Against Medical Advice | Payer: 59 | Attending: Emergency Medicine | Admitting: Emergency Medicine

## 2021-12-04 ENCOUNTER — Other Ambulatory Visit: Payer: Self-pay

## 2021-12-04 ENCOUNTER — Encounter (HOSPITAL_COMMUNITY): Payer: Self-pay

## 2021-12-04 DIAGNOSIS — K625 Hemorrhage of anus and rectum: Secondary | ICD-10-CM | POA: Diagnosis not present

## 2021-12-04 DIAGNOSIS — Z5321 Procedure and treatment not carried out due to patient leaving prior to being seen by health care provider: Secondary | ICD-10-CM | POA: Diagnosis not present

## 2021-12-04 DIAGNOSIS — K921 Melena: Secondary | ICD-10-CM | POA: Diagnosis not present

## 2021-12-04 LAB — CBC
HCT: 41.7 % (ref 39.0–52.0)
Hemoglobin: 14 g/dL (ref 13.0–17.0)
MCH: 31.7 pg (ref 26.0–34.0)
MCHC: 33.6 g/dL (ref 30.0–36.0)
MCV: 94.3 fL (ref 80.0–100.0)
Platelets: 295 10*3/uL (ref 150–400)
RBC: 4.42 MIL/uL (ref 4.22–5.81)
RDW: 13.1 % (ref 11.5–15.5)
WBC: 12.1 10*3/uL — ABNORMAL HIGH (ref 4.0–10.5)
nRBC: 0 % (ref 0.0–0.2)

## 2021-12-04 LAB — BASIC METABOLIC PANEL
Anion gap: 10 (ref 5–15)
BUN: 15 mg/dL (ref 8–23)
CO2: 25 mmol/L (ref 22–32)
Calcium: 9.4 mg/dL (ref 8.9–10.3)
Chloride: 105 mmol/L (ref 98–111)
Creatinine, Ser: 0.87 mg/dL (ref 0.61–1.24)
GFR, Estimated: >60 mL/min (ref >60)
Glucose, Bld: 129 mg/dL — ABNORMAL HIGH (ref 70–99)
Potassium: 4 mmol/L (ref 3.5–5.1)
Sodium: 140 mmol/L (ref 135–145)

## 2021-12-04 NOTE — ED Triage Notes (Signed)
Pt arrived POV from home c/o black tarry stools. Pt denies any pain but states he does feel weak and dizzy.

## 2021-12-04 NOTE — ED Notes (Signed)
Patient left without being seen.

## 2021-12-04 NOTE — ED Triage Notes (Signed)
Pt reports "black liquid stool" x3 episodes this morning. Reports intermittent generalized weakness. Denies any abd pain, emesis.

## 2021-12-04 NOTE — Discharge Instructions (Signed)
Go to the emergency department for further evaluation

## 2021-12-04 NOTE — ED Provider Triage Note (Signed)
Emergency Medicine Provider Triage Evaluation Note  Marco Stevenson , a 66 y.o. male  was evaluated in triage.  Pt complains of blood in stool starting this morning. Had three episodes of dark black stools, one more loose than the others. Had some dark roast beef last night and thinks this may have been contributed. No other symptoms. Hx of GI bleed from polyp in his stomach that had "ruptured". Not on blood thinners  Review of Systems  Positive: Blood in stool Negative: Abd pain, rectal pain, N/V  Physical Exam  BP (!) 134/93 (BP Location: Right Arm)   Pulse (!) 105   Temp 98.1 F (36.7 C) (Oral)   Resp 17   Ht 5\' 10"  (1.778 m)   Wt 95.3 kg   SpO2 95%   BMI 30.13 kg/m  Gen:   Awake, no distress   Resp:  Normal effort  MSK:   Moves extremities without difficulty  Other:    Medical Decision Making  Medically screening exam initiated at 2:24 PM.  Appropriate orders placed.  Marco Stevenson was informed that the remainder of the evaluation will be completed by another provider, this initial triage assessment does not replace that evaluation, and the importance of remaining in the ED until their evaluation is complete.  Workup initiated    Marco Buntin T, PA-C 12/04/21 1426

## 2021-12-04 NOTE — ED Provider Notes (Signed)
[facility]    CSN: [id number] Arrival date & time: 12/04/21  1223      History   Chief Complaint Chief Complaint  Patient presents with   Rectal Bleeding    HPI Marco Stevenson is a 66 y.o. male.   The history is provided by the patient.   Patient presents for complaints of dark stool that started this morning.  Patient states that he has had 3 episodes of diarrhea-like stools that are "black".  He also complains of generalized weakness, or recent antibiotic use..  Patient states he had a history of the same symptoms and loss approximately 4 pints of blood.  Patient states "I feel like I am bleeding really bad and on the inside".  He states that he is not on any blood thinners.  Past Medical History:  Diagnosis Date   Acid reflux    Anxiety    Depression    Hypertension     Patient Active Problem List   Diagnosis Date Noted   GI bleed 05/26/2020   Hypoalbuminemia due to protein-calorie malnutrition ([place]) 05/26/2020   Transaminitis 05/26/2020   Hyperglycemia 05/26/2020   Hematemesis 05/26/2020   Nausea & vomiting 05/26/2020   Obesity (BMI 30.0-34.9) 05/26/2020   Renal mass 09/24/2019   Enteritis 04/10/2019   Syncopal episodes 05/12/2018   UGI bleed 05/11/2018   Hypertension 05/11/2018   Alcohol abuse 05/11/2018   GERD (gastroesophageal reflux disease) 05/11/2018   Hypomagnesemia 05/11/2018   Acute blood loss anemia 05/11/2018    Past Surgical History:  Procedure Laterality Date   BIOPSY  05/14/2018   Procedure: BIOPSY;  Surgeon: Clarene Essex, MD;  Location: [place];  Service: Endoscopy;;   COLONOSCOPY     ESOPHAGOGASTRODUODENOSCOPY (EGD) WITH PROPOFOL N/A 05/12/2018   Procedure: ESOPHAGOGASTRODUODENOSCOPY (EGD) WITH PROPOFOL;  Surgeon: Danie Binder, MD;  [facility];  Service: Endoscopy;  Laterality: N/A;   ESOPHAGOGASTRODUODENOSCOPY (EGD) WITH PROPOFOL N/A 05/14/2018   Procedure: ESOPHAGOGASTRODUODENOSCOPY (EGD) WITH PROPOFOL;   Surgeon: Clarene Essex, MD;  Location: [place];  Service: Endoscopy;  Laterality: N/A;  start with side-viewing scope   ESOPHAGOGASTRODUODENOSCOPY (EGD) WITH PROPOFOL N/A 09/23/2018   Procedure: ESOPHAGOGASTRODUODENOSCOPY (EGD) WITH PROPOFOL;  Surgeon: Arta Silence, MD;  [facility] ENDOSCOPY;  Service: Endoscopy;  Laterality: N/A;  endo loop   ESOPHAGOGASTRODUODENOSCOPY (EGD) WITH PROPOFOL N/A 05/27/2020   Procedure: ESOPHAGOGASTRODUODENOSCOPY (EGD) WITH PROPOFOL;  Surgeon: Eloise Harman, DO;  [facility];  Service: Endoscopy;  Laterality: N/A;   EUS N/A 09/23/2018   Procedure: UPPER ENDOSCOPIC ULTRASOUND (EUS) RADIAL;  Surgeon: Arta Silence, MD;  Location: [facility] ENDOS[facility];  Service: Endoscopy;  Laterality: N/A;   HERNIA REPAIR     POLYPECTOMY  05/12/2018   Procedure: POLYPECTOMY;  Surgeon: Danie Binder, MD;  [facility];  Service: Endoscopy;;  polyp gastric   POLYPECTOMY  09/23/2018   Procedure: POLYPECTOMY;  Surgeon: Arta Silence, MD;  [facility]OS[facility];  Service: Endoscopy;;   POLYPECTOMY  05/27/2020   Procedure: POLYPECTOMY;  Surgeon: Eloise Harman, DO;  [facility];  Service: Endoscopy;;  gastric   ROBOTIC ASSITED PARTIAL NEPHRECTOMY Right 09/24/2019   Procedure: XI ROBOTIC ASSITED PARTIAL NEPHRECTOMY;  Surgeon: Ceasar Mons, MD;  Location: [facility];  Service: Urology;  Laterality: Right;   SUBMUCOSAL INJECTION  09/23/2018   Procedure: SUBMUCOSAL INJECTION;  Surgeon: Arta Silence, MD;  [facility];  Service: Endoscopy;;       Home Medications  Prior to Admission medications   Medication Sig Start Date End Date Taking? Authorizing Provider  acetaminophen (TYLENOL) 325 MG tablet Take 2 tablets (650 mg total) by mouth every 6 (six) hours as needed for mild pain or moderate pain (Call Dr. Marlane Hatcher office if you need more pain medicine). 04/12/19   Sherrie George, PA-C  ALPRAZolam Prudy Feeler) 1 MG tablet Take 1 mg by  mouth at bedtime as needed for sleep. 1 tablet at bedtime, and at 3am will take 1/2 tablet 03/13/18   [provider]  chlordiazePOXIDE (LIBRIUM) 25 MG capsule 50mg  PO TID x 1D, then 25-50mg  PO BID X 1D, then 25-50mg  PO QD X 1D Patient not taking: Reported on 01/12/2021 07/16/20   07/18/20, PA-C  docusate sodium (COLACE) 100 MG capsule Take 1 capsule (100 mg total) by mouth 2 (two) times daily. Patient not taking: Reported on 01/12/2021 09/24/19   11/24/19, PA-C  HYDROcodone-acetaminophen (NORCO) 5-325 MG tablet Take 1-2 tablets by mouth every 6 (six) hours as needed for moderate pain. Patient not taking: Reported on 01/12/2021 09/24/19   11/24/19, PA-C  losartan (COZAAR) 100 MG tablet Take 100 mg by mouth daily.  04/08/18   [provider]  Multiple Vitamins-Minerals (ONE-A-DAY MENS HEALTH FORMULA PO) Take 1 tablet by mouth daily.    [provider]  pantoprazole (PROTONIX) 40 MG tablet TAKE 1 TABLET BY MOUTH TWICE A DAY 02/13/21   02/15/21, NP  promethazine (PHENERGAN) 12.5 MG tablet Take 1 tablet (12.5 mg total) by mouth every 4 (four) hours as needed for nausea or vomiting. Patient not taking: Reported on 01/12/2021 09/24/19   11/24/19, PA-C  sildenafil (REVATIO) 20 MG tablet Take 20 mg by mouth daily as needed. TAKE UP TO 5 TABLETS BY MOUTH ONE HOUR PRIOR TO INTERCOURSE FOR ED Patient not taking: Reported on 01/12/2021    [provider]  Tetrahydrozoline HCl (VISINE OP) Place 1 drop into both eyes daily as needed (dry eyes).    [provider]    Family History Family History  Problem Relation Age of Onset   Hypertension Father    Colon cancer Neg Hx    Colon polyps Neg Hx     Social History Social History   Tobacco Use   Smoking status: Never   Smokeless tobacco: Never  Vaping Use   Vaping Use: Never used  Substance Use Topics   Alcohol use: Yes    Comment: drank last night   Drug use: No     Allergies    Patient has no known allergies.   Review of Systems Review of Systems Per HPI  Physical Exam Triage Vital Signs ED Triage Vitals  Enc Vitals Group     BP 12/04/21 1239 (!) 141/89     Pulse Rate 12/04/21 1239 (!) 113     Resp 12/04/21 1239 20     Temp 12/04/21 1239 97.9 F (36.6 C)     Temp Source 12/04/21 1239 Oral     SpO2 12/04/21 1239 96 %     Weight --      Height --      Head Circumference --      Peak Flow --      Pain Score 12/04/21 1240 0     Pain Loc --      Pain Edu? --      Excl. in GC? --    No data found.  Updated Vital Signs BP (!) 141/89 (  BP Location: Right Arm)   Pulse (!) 113   Temp 97.9 F (36.6 C) (Oral)   Resp 20   SpO2 96%   Visual Acuity Right Eye Distance:   Left Eye Distance:   Bilateral Distance:    Right Eye Near:   Left Eye Near:    Bilateral Near:     Physical Exam Vitals and nursing note reviewed.  Constitutional:      General: He is not in acute distress.    Appearance: Normal appearance.  HENT:     Head: Normocephalic.  Cardiovascular:     Rate and Rhythm: Regular rhythm. Tachycardia present.     Pulses: Normal pulses.     Heart sounds: Normal heart sounds.  Pulmonary:     Effort: Pulmonary effort is normal.     Breath sounds: Normal breath sounds.  Abdominal:     General: Bowel sounds are normal.     Palpations: Abdomen is soft.     Tenderness: There is no abdominal tenderness. There is no rebound.  Musculoskeletal:     Cervical back: Normal range of motion.  Lymphadenopathy:     Cervical: No cervical adenopathy.  Skin:    General: Skin is warm and dry.  Neurological:     General: No focal deficit present.     Mental Status: He is alert and oriented to person, place, and time.  Psychiatric:        Mood and Affect: Mood normal.        Behavior: Behavior normal.      UC Treatments / Results  Labs (all labs ordered are listed, but only abnormal results are displayed) Labs Reviewed - No data to  display  EKG   Radiology No results found.  Procedures Procedures (including critical care time)  Medications Ordered in UC Medications - No data to display  Initial Impression / Assessment and Plan / UC Course  I have reviewed the triage vital signs and the nursing notes.  Pertinent labs & imaging results that were available during my care of the patient were reviewed by me and considered in my medical decision making (see chart for details).  Patient presents for complaints of rectal bleeding that started this morning.  He reports 3 "dark" stools that have been loose.  On exam, he is mildly tachycardic and hypertensive, but is currently in no acute distress.  His physical exam with this in normal limits; however, explained to patient that he will need follow-up in the emergency department for further evaluation based on his complaint.  Patient verbalizes understanding.  Patient's vitals are stable for transport to the local emergency department. Final Clinical Impressions(s) / UC Diagnoses   Final diagnoses:  Rectal bleeding     Discharge Instructions      Go to the emergency department for further evaluation.     ED Prescriptions   None    PDMP not reviewed this encounter.   Abran Cantor, NP 12/04/21 1258

## 2023-01-06 ENCOUNTER — Emergency Department (HOSPITAL_COMMUNITY)
Admission: EM | Admit: 2023-01-06 | Discharge: 2023-01-06 | Discharge status: Against Medical Advice | Payer: 59 | Attending: Emergency Medicine | Admitting: Emergency Medicine

## 2023-01-06 ENCOUNTER — Encounter (HOSPITAL_COMMUNITY): Payer: Self-pay

## 2023-01-06 ENCOUNTER — Other Ambulatory Visit: Payer: Self-pay

## 2023-01-06 DIAGNOSIS — Z5321 Procedure and treatment not carried out due to patient leaving prior to being seen by health care provider: Secondary | ICD-10-CM | POA: Insufficient documentation

## 2023-01-06 DIAGNOSIS — K921 Melena: Secondary | ICD-10-CM | POA: Insufficient documentation

## 2023-01-06 NOTE — ED Triage Notes (Signed)
Pt comes in with dark stool this morning. Pt states previous having an episode of this from eating roast beef and same thing has occurred. Pt states has not had a BM since so unsure if still dark.

## 2023-10-08 DIAGNOSIS — E669 Obesity, unspecified: Secondary | ICD-10-CM | POA: Diagnosis not present

## 2023-10-08 DIAGNOSIS — I10 Essential (primary) hypertension: Secondary | ICD-10-CM | POA: Diagnosis not present

## 2023-10-14 DIAGNOSIS — Z905 Acquired absence of kidney: Secondary | ICD-10-CM | POA: Diagnosis not present

## 2023-10-14 DIAGNOSIS — I10 Essential (primary) hypertension: Secondary | ICD-10-CM | POA: Diagnosis not present

## 2023-10-14 DIAGNOSIS — E669 Obesity, unspecified: Secondary | ICD-10-CM | POA: Diagnosis not present

## 2023-10-14 DIAGNOSIS — F418 Other specified anxiety disorders: Secondary | ICD-10-CM | POA: Diagnosis not present

## 2023-10-14 DIAGNOSIS — Z0001 Encounter for general adult medical examination with abnormal findings: Secondary | ICD-10-CM | POA: Diagnosis not present

## 2023-10-14 DIAGNOSIS — K700 Alcoholic fatty liver: Secondary | ICD-10-CM | POA: Diagnosis not present

## 2023-10-14 DIAGNOSIS — F5104 Psychophysiologic insomnia: Secondary | ICD-10-CM | POA: Diagnosis not present

## 2023-10-14 DIAGNOSIS — Z85528 Personal history of other malignant neoplasm of kidney: Secondary | ICD-10-CM | POA: Diagnosis not present

## 2023-10-14 DIAGNOSIS — R7301 Impaired fasting glucose: Secondary | ICD-10-CM | POA: Diagnosis not present

## 2023-10-14 DIAGNOSIS — K219 Gastro-esophageal reflux disease without esophagitis: Secondary | ICD-10-CM | POA: Diagnosis not present

## 2023-10-14 DIAGNOSIS — K5792 Diverticulitis of intestine, part unspecified, without perforation or abscess without bleeding: Secondary | ICD-10-CM | POA: Diagnosis not present

## 2023-10-14 DIAGNOSIS — Z9889 Other specified postprocedural states: Secondary | ICD-10-CM | POA: Diagnosis not present

## 2023-10-27 DIAGNOSIS — M67432 Ganglion, left wrist: Secondary | ICD-10-CM | POA: Diagnosis not present

## 2023-11-03 DIAGNOSIS — M25532 Pain in left wrist: Secondary | ICD-10-CM | POA: Diagnosis not present

## 2023-11-03 DIAGNOSIS — M67432 Ganglion, left wrist: Secondary | ICD-10-CM | POA: Diagnosis not present

## 2023-11-26 DIAGNOSIS — M25561 Pain in right knee: Secondary | ICD-10-CM | POA: Diagnosis not present

## 2023-11-26 DIAGNOSIS — M25361 Other instability, right knee: Secondary | ICD-10-CM | POA: Diagnosis not present

## 2023-12-08 DIAGNOSIS — M25561 Pain in right knee: Secondary | ICD-10-CM | POA: Diagnosis not present

## 2023-12-18 DIAGNOSIS — M25561 Pain in right knee: Secondary | ICD-10-CM | POA: Diagnosis not present

## 2023-12-29 DIAGNOSIS — M7121 Synovial cyst of popliteal space [Baker], right knee: Secondary | ICD-10-CM | POA: Diagnosis not present

## 2023-12-29 DIAGNOSIS — M84451A Pathological fracture, right femur, initial encounter for fracture: Secondary | ICD-10-CM | POA: Diagnosis not present

## 2023-12-29 DIAGNOSIS — M1711 Unilateral primary osteoarthritis, right knee: Secondary | ICD-10-CM | POA: Diagnosis not present

## 2024-01-19 DIAGNOSIS — M25561 Pain in right knee: Secondary | ICD-10-CM | POA: Diagnosis not present

## 2024-01-19 DIAGNOSIS — M1711 Unilateral primary osteoarthritis, right knee: Secondary | ICD-10-CM | POA: Diagnosis not present
